# Patient Record
Sex: Male | Born: 1960
Health system: Southern US, Community
[De-identification: ages and names within clinical notes are randomized; demographics above are authoritative.]

## PROBLEM LIST (undated history)

## (undated) DIAGNOSIS — G43909 Migraine, unspecified, not intractable, without status migrainosus: Secondary | ICD-10-CM

## (undated) DIAGNOSIS — M1990 Unspecified osteoarthritis, unspecified site: Secondary | ICD-10-CM

## (undated) HISTORY — DX: Unspecified osteoarthritis, unspecified site: M19.90

## (undated) HISTORY — DX: Migraine, unspecified, not intractable, without status migrainosus: G43.909

---

## 1999-06-04 ENCOUNTER — Emergency Department (HOSPITAL_COMMUNITY): Admission: EM | Admit: 1999-06-04 | Discharge: 1999-06-04 | Payer: Self-pay

## 1999-08-01 ENCOUNTER — Emergency Department (HOSPITAL_COMMUNITY): Admission: EM | Admit: 1999-08-01 | Discharge: 1999-08-01 | Payer: Self-pay | Admitting: Emergency Medicine

## 1999-08-01 ENCOUNTER — Encounter: Payer: Self-pay | Admitting: Emergency Medicine

## 1999-09-10 ENCOUNTER — Emergency Department (HOSPITAL_COMMUNITY): Admission: EM | Admit: 1999-09-10 | Discharge: 1999-09-10 | Payer: Self-pay | Admitting: Emergency Medicine

## 1999-09-10 ENCOUNTER — Encounter: Payer: Self-pay | Admitting: Emergency Medicine

## 1999-09-13 ENCOUNTER — Emergency Department (HOSPITAL_COMMUNITY): Admission: EM | Admit: 1999-09-13 | Discharge: 1999-09-13 | Payer: Self-pay | Admitting: Emergency Medicine

## 1999-09-21 ENCOUNTER — Other Ambulatory Visit (HOSPITAL_COMMUNITY): Admission: RE | Admit: 1999-09-21 | Discharge: 1999-10-05 | Payer: Self-pay | Admitting: Psychiatry

## 2003-10-24 ENCOUNTER — Emergency Department (HOSPITAL_COMMUNITY): Admission: EM | Admit: 2003-10-24 | Discharge: 2003-10-24 | Payer: Self-pay | Admitting: Emergency Medicine

## 2006-09-22 ENCOUNTER — Emergency Department (HOSPITAL_COMMUNITY): Admission: EM | Admit: 2006-09-22 | Discharge: 2006-09-22 | Payer: Self-pay | Admitting: Emergency Medicine

## 2006-10-03 ENCOUNTER — Ambulatory Visit: Payer: Self-pay | Admitting: Nurse Practitioner

## 2006-10-06 ENCOUNTER — Emergency Department (HOSPITAL_COMMUNITY): Admission: EM | Admit: 2006-10-06 | Discharge: 2006-10-06 | Payer: Self-pay | Admitting: *Deleted

## 2006-11-08 ENCOUNTER — Emergency Department (HOSPITAL_COMMUNITY): Admission: EM | Admit: 2006-11-08 | Discharge: 2006-11-08 | Payer: Self-pay | Admitting: Emergency Medicine

## 2008-04-15 ENCOUNTER — Emergency Department (HOSPITAL_COMMUNITY): Admission: EM | Admit: 2008-04-15 | Discharge: 2008-04-15 | Payer: Self-pay | Admitting: Emergency Medicine

## 2008-10-01 ENCOUNTER — Emergency Department: Payer: Self-pay | Admitting: Emergency Medicine

## 2009-01-20 ENCOUNTER — Emergency Department (HOSPITAL_COMMUNITY): Admission: EM | Admit: 2009-01-20 | Discharge: 2009-01-21 | Payer: Self-pay | Admitting: Emergency Medicine

## 2009-04-14 ENCOUNTER — Ambulatory Visit: Payer: Self-pay | Admitting: Diagnostic Radiology

## 2009-04-14 ENCOUNTER — Emergency Department (HOSPITAL_BASED_OUTPATIENT_CLINIC_OR_DEPARTMENT_OTHER): Admission: EM | Admit: 2009-04-14 | Discharge: 2009-04-14 | Payer: Self-pay | Admitting: Emergency Medicine

## 2011-04-08 ENCOUNTER — Emergency Department (HOSPITAL_COMMUNITY)
Admission: EM | Admit: 2011-04-08 | Discharge: 2011-04-09 | Disposition: A | Discharge status: Home / Self Care | Payer: Self-pay | Attending: Emergency Medicine | Admitting: Emergency Medicine

## 2011-04-08 DIAGNOSIS — R51 Headache: Secondary | ICD-10-CM | POA: Insufficient documentation

## 2011-04-09 ENCOUNTER — Emergency Department (HOSPITAL_COMMUNITY): Payer: Self-pay

## 2011-04-09 LAB — POCT I-STAT, CHEM 8
Calcium, Ion: 1.17 mmol/L (ref 1.12–1.32)
Chloride: 107 mEq/L (ref 96–112)
Creatinine, Ser: 1 mg/dL (ref 0.50–1.35)
Glucose, Bld: 89 mg/dL (ref 70–99)
HCT: 45 % (ref 39.0–52.0)
Hemoglobin: 15.3 g/dL (ref 13.0–17.0)

## 2011-06-01 ENCOUNTER — Ambulatory Visit (INDEPENDENT_AMBULATORY_CARE_PROVIDER_SITE_OTHER): Payer: Self-pay | Admitting: Family Medicine

## 2011-06-01 ENCOUNTER — Encounter: Payer: Self-pay | Admitting: Family Medicine

## 2011-06-01 DIAGNOSIS — M19049 Primary osteoarthritis, unspecified hand: Secondary | ICD-10-CM

## 2011-06-01 DIAGNOSIS — K029 Dental caries, unspecified: Secondary | ICD-10-CM

## 2011-06-01 DIAGNOSIS — R519 Headache, unspecified: Secondary | ICD-10-CM | POA: Insufficient documentation

## 2011-06-01 DIAGNOSIS — R51 Headache: Secondary | ICD-10-CM

## 2011-06-01 MED ORDER — ACETAMINOPHEN 325 MG PO TABS
650.0000 mg | ORAL_TABLET | Freq: Three times a day (TID) | ORAL | Status: AC | PRN
Start: 2011-06-01 — End: 2011-06-11

## 2011-06-01 NOTE — Assessment & Plan Note (Signed)
Discussed with patient at this time will start treating conservatively with Tylenol 650 mg 3 times a day to see if this improves the pain. Once patient is covered by [facility] we'll consider then broadening the medications as well. Hopefully though this does help

## 2011-06-01 NOTE — Assessment & Plan Note (Signed)
Patient does have a left lower molar that is significantly necrotic and it has been decayed into the gum lining feel at this time this is likely the cause of patient's headache and do that it is dead and seems to be improving but concerned that the necrosis could expand to his gums. Patient has no insurance at this time so is unable to send him to a dentist. Patient is going to be seeing [facility] Eagles here in the near future and at that time once covered we will send him to a dentist at this time do not feel that patient needs any antibiotics he has had no fevers or chills we'll continue to monitor.

## 2011-06-01 NOTE — Progress Notes (Signed)
  Subjective:    Patient ID: Andrew Frye, male    DOB: 28-Sep-1960, 50 y.o.   MRN: [id number]  HPI 50 year old male coming here as a new patient to establish care. 1.  migraines. Patient has been seen in the emergency department on multiple occasions for having a left-sided migraine pain. Patient has never had a primary care provider before and is looking for somebody to help him with this problem. Patient was last seen though approximately 2 weeks ago and since then actually has not had any headaches. Patient did notice on the left side he does have a molar that seems to have a very large cavity in it and might have been causing him pain when to do so finally died the pain went away. Patient denies any type of fevers or chills denies any changes in vision denies any pulsatile headache denies noticing any other triggers.  2. Arthritis: Patient has been working with his hands his entire life and now with certain movements and change in weather he noticed that he has a lot of soreness and tightness in his hands. Patient still able to do all his activities of daily living but does notice that he has significant more pain than he did before. Patient has not tried taking any medications over-the-counter and has not tried ice or heat. Patient is unable to tell the exact that seemed to hurt him.  Denies any loss of strength or loss of weakness.   Review of Systems Denies fever, chills, nausea vomiting abdominal pain, dysuria, chest pain, shortness of breath dyspnea on exertion or numbness in extremities Past medical history, social, surgical and family history all reviewed.   Past Medical History  Diagnosis Date  . Migraines   . Arthritis     hands   Family History  Problem Relation Age of Onset  . Cancer Maternal Grandmother     breast  . Diabetes Mother   . Kidney disease Mother        Objective:   Physical Exam General: No apparent distress Patient does have a left lower molar that is  significantly necrotic and it has been decayed into the gum lining  Cardiovascular: Regular rate and rhythm no murmur Pulmonary: Clear to auscultation bilaterally Abdomen: Bowel sounds positive nontender no masses no organomegaly patient though does have a small inguinal lymph node is minorly tender patient does state he had a cold recently Muscle skeletal: Patient's hands have significant deformity of the first MCP joint with crepitus on movement patient also has some arthritic changes of most of the distal and proximal phalanx. Neurovascularly intact in all extremities    Assessment & Plan:

## 2011-06-01 NOTE — Patient Instructions (Signed)
Nice to meet you. I want you to get the [facility] card as soon as you can. After you get back card I when she to come back and see me we will then get she did see a dentist as well as scheduling a colonoscopy and get your labs I when she to try Tylenol 650 mg 3 times a day for your hand pain I will see you when you get the orange card Have a great Thanksgiving

## 2011-10-01 ENCOUNTER — Encounter (HOSPITAL_COMMUNITY): Payer: Self-pay | Admitting: *Deleted

## 2011-10-01 ENCOUNTER — Emergency Department (HOSPITAL_COMMUNITY)
Admission: EM | Admit: 2011-10-01 | Discharge: 2011-10-01 | Disposition: A | Discharge status: Home / Self Care | Payer: Self-pay | Attending: Emergency Medicine | Admitting: Emergency Medicine

## 2011-10-01 DIAGNOSIS — Z880 Allergy status to penicillin: Secondary | ICD-10-CM | POA: Insufficient documentation

## 2011-10-01 DIAGNOSIS — M19049 Primary osteoarthritis, unspecified hand: Secondary | ICD-10-CM | POA: Insufficient documentation

## 2011-10-01 DIAGNOSIS — G43909 Migraine, unspecified, not intractable, without status migrainosus: Secondary | ICD-10-CM | POA: Insufficient documentation

## 2011-10-01 DIAGNOSIS — K029 Dental caries, unspecified: Secondary | ICD-10-CM | POA: Insufficient documentation

## 2011-10-01 DIAGNOSIS — K047 Periapical abscess without sinus: Secondary | ICD-10-CM | POA: Insufficient documentation

## 2011-10-01 MED ORDER — CLINDAMYCIN HCL 150 MG PO CAPS: 300.0000 mg | ORAL_CAPSULE | Freq: Two times a day (BID) | ORAL | Status: AC

## 2011-10-01 MED ORDER — BUPIVACAINE HCL (PF) 0.5 % IJ SOLN
INTRAMUSCULAR | Status: AC
  Filled 2011-10-01: qty 10

## 2011-10-01 MED ORDER — BUPIVACAINE HCL 0.5 % IJ SOLN
50.0000 mL | INTRAMUSCULAR | Status: DC
  Filled 2011-10-01: qty 50

## 2011-10-01 MED ORDER — HYDROCODONE-ACETAMINOPHEN 5-325 MG PO TABS: 1.0000 | ORAL_TABLET | ORAL | Status: AC | PRN

## 2011-10-01 NOTE — ED Notes (Signed)
Pt c/o gum abscess lett lower back tooth.  St's has not drained. C/o pain

## 2011-10-01 NOTE — ED Provider Notes (Signed)
History     CSN: [id number]  Arrival date & time 10/01/11  2130   First MD Initiated Contact with Patient 10/01/11 2109      Chief Complaint  Patient presents with  . Dental Pain    (Consider location/radiation/quality/duration/timing/severity/associated sxs/prior treatment) HPI History provided by pt.   Pt has had a constant, gradually worsening, left lower toothache x 3-4 days.  Pain radiates up left side of face to his temple.  Little relief w/ ibuprofen/aleve.  No associated fever.  Pt does not currently have a dentist or dental insurance.   Past Medical History  Diagnosis Date  . Migraines   . Arthritis     hands    History reviewed. No pertinent past surgical history.  Family History  Problem Relation Age of Onset  . Cancer Maternal Grandmother     breast  . Diabetes Mother   . Kidney disease Mother     History  Substance Use Topics  . Smoking status: Never Smoker   . Smokeless tobacco: Not on file  . Alcohol Use: No      Review of Systems  All other systems reviewed and are negative.    Allergies  Penicillins  Home Medications   Current Outpatient Rx  Name Route Sig Dispense Refill  . IBUPROFEN 200 MG PO TABS Oral Take 400 mg by mouth every 6 (six) hours as needed. For pain.    Marland Kitchen NAPROXEN SODIUM 220 MG PO TABS Oral Take 440 mg by mouth 2 (two) times daily with a meal.      BP 145/84  Pulse 74  Temp 98.8 F (37.1 C)  SpO2 96%  Physical Exam  Nursing note and vitals reviewed. Constitutional: He is oriented to person, place, and time. He appears well-developed and well-nourished.  HENT:  Head: Normocephalic and atraumatic. No trismus in the jaw.  Mouth/Throat: Uvula is midline and oropharynx is clear and moist.       Left lower third molar avulsed and remainng tooth decayed.  Decay of left second lower molar as well.  Adjacent gingiva edematous and ttp.    Eyes:       Normal appearance  Neck: Normal range of motion. Neck supple.    Lymphadenopathy:    He has no cervical adenopathy.  Neurological: He is alert and oriented to person, place, and time.  Psychiatric: He has a normal mood and affect. His behavior is normal.    ED Course  Dental Date/Time: 10/01/2011 9:40 PM Performed by: Otilio Miu Authorized by: Ruby Cola E Consent: Verbal consent obtained. Risks and benefits: risks, benefits and alternatives were discussed Consent given by: patient Local anesthesia used: yes Local anesthetic: bupivacaine 0.5% without epinephrine Patient tolerance: Patient tolerated the procedure well with no immediate complications. Comments: Aspiration of moderate amt of pus from left lower gingival abscess.     (including critical care time)   Labs Reviewed - No data to display No results found.   1. Dental abscess       MDM  Pt presents w/ gingival abscess at left lower 2nd and 3rd molars.  Abscess anesthetized and aspirated and pain improved.  Pt d/c'd home clindamycin (pen allergic), hydrocodone and referral to dentist on call.  Return precautions discussed.         Arie Sabina [place], [place] 10/01/11 [phone]

## 2011-10-01 NOTE — Discharge Instructions (Signed)
Take antibiotic as prescribed.  Take vicodin as prescribed for severe pain.   Do not drive within four hours of taking this medication (may cause drowsiness or confusion).  Continue to take aleve twice a day OR ibuprofen three times a day with food as well.  Follow up with the dentist on call or one of the dental clinics provided to you.  You should return to the ER if the swelling worsens before you are able to see the dentist.

## 2011-10-01 NOTE — ED Notes (Signed)
Toothache and a headache for one week

## 2011-10-02 NOTE — ED Provider Notes (Signed)
Medical screening examination/treatment/procedure(s) were performed by non-physician practitioner and as supervising physician I was immediately available for consultation/collaboration.    Jeriko Kowalke L Shara Hartis, MD 10/02/11 0954 

## 2012-12-16 ENCOUNTER — Encounter (HOSPITAL_BASED_OUTPATIENT_CLINIC_OR_DEPARTMENT_OTHER): Payer: Self-pay | Admitting: *Deleted

## 2012-12-16 ENCOUNTER — Emergency Department (HOSPITAL_BASED_OUTPATIENT_CLINIC_OR_DEPARTMENT_OTHER)
Admission: EM | Admit: 2012-12-16 | Discharge: 2012-12-16 | Disposition: A | Discharge status: Home / Self Care | Payer: BC Managed Care – PPO | Attending: Emergency Medicine | Admitting: Emergency Medicine

## 2012-12-16 DIAGNOSIS — M542 Cervicalgia: Secondary | ICD-10-CM | POA: Insufficient documentation

## 2012-12-16 DIAGNOSIS — M25529 Pain in unspecified elbow: Secondary | ICD-10-CM | POA: Insufficient documentation

## 2012-12-16 DIAGNOSIS — Z8679 Personal history of other diseases of the circulatory system: Secondary | ICD-10-CM | POA: Insufficient documentation

## 2012-12-16 DIAGNOSIS — Z880 Allergy status to penicillin: Secondary | ICD-10-CM | POA: Insufficient documentation

## 2012-12-16 DIAGNOSIS — M25511 Pain in right shoulder: Secondary | ICD-10-CM

## 2012-12-16 DIAGNOSIS — M25519 Pain in unspecified shoulder: Secondary | ICD-10-CM | POA: Insufficient documentation

## 2012-12-16 DIAGNOSIS — M19049 Primary osteoarthritis, unspecified hand: Secondary | ICD-10-CM | POA: Insufficient documentation

## 2012-12-16 MED ORDER — PREDNISONE 10 MG PO TABS: 20.0000 mg | ORAL_TABLET | Freq: Two times a day (BID) | ORAL | Status: DC

## 2012-12-16 MED ORDER — HYDROCODONE-ACETAMINOPHEN 5-325 MG PO TABS: 2.0000 | ORAL_TABLET | ORAL | Status: DC | PRN

## 2012-12-16 NOTE — ED Notes (Signed)
C/o right neck pain, right shoulder pain and right elbow pain. Denies any injury. States pain is worse in the morning. States pain is constant. States pain for past 2 weeks.

## 2012-12-16 NOTE — ED Provider Notes (Signed)
History  This chart was scribed for Geoffery Lyons, MD by Ardelia Mems, ED Scribe. This patient was seen in room MH09/MH09 and the patient's care was started at 10:00 PM.   CSN: [id number]  Arrival date & time 12/16/12  5621      Chief Complaint  Patient presents with  . Shoulder Pain     The history is provided by the patient. No language interpreter was used.    HPI Comments: Andrew Frye is a 52 y.o. male who presents to the Emergency Department complaining of 3 weeks of constant,. moderate right- sided shoulder pain that is at its worst intensity in the morning. Pt states that his right elbow and the right side of his neck also hurt. Pt denies falls or recent injuries. Pt states that he has never had problems like these before. Pt states that his shoulder pain is exacerbated by movement of his right arm. Pt states that he has arthritis and that his entire right arm is sore.   Past Medical History  Diagnosis Date  . Migraines   . Arthritis     hands    History reviewed. No pertinent past surgical history.  Family History  Problem Relation Age of Onset  . Cancer Maternal Grandmother     breast  . Diabetes Mother   . Kidney disease Mother     History  Substance Use Topics  . Smoking status: Never Smoker   . Smokeless tobacco: Not on file  . Alcohol Use: No      Review of Systems  Constitutional: Negative for fever and chills.  HENT: Positive for neck pain.   Respiratory: Negative for shortness of breath.   Cardiovascular: Negative for chest pain.  Gastrointestinal: Negative for nausea, vomiting, abdominal pain and diarrhea.  Musculoskeletal: Negative for back pain.  Neurological: Negative for headaches.  All other systems reviewed and are negative.    Allergies  Penicillins  Home Medications   Current Outpatient Rx  Name  Route  Sig  Dispense  Refill  . ibuprofen (ADVIL,MOTRIN) 200 MG tablet   Oral   Take 400 mg by mouth every 6 (six) hours as  needed. For pain.         . naproxen sodium (ANAPROX) 220 MG tablet   Oral   Take 440 mg by mouth 2 (two) times daily with a meal.           Triage Vitals: BP 110/69  Pulse 64  Temp(Src) 98.9 F (37.2 C)  Resp 18  Ht 5\' 8"  (1.727 m)  Wt 172 lb 5 oz (78.16 kg)  BMI 26.21 kg/m2  SpO2 96%  Physical Exam  Nursing note and vitals reviewed. Constitutional: He is oriented to person, place, and time. He appears well-developed and well-nourished.  HENT:  Head: Normocephalic and atraumatic.  Eyes: EOM are normal. Pupils are equal, round, and reactive to light.  Neck: No tracheal deviation present.  Cardiovascular: Normal rate, regular rhythm and normal heart sounds.   No murmur heard. Pulmonary/Chest: Effort normal and breath sounds normal. No respiratory distress.  Abdominal: Soft. Bowel sounds are normal. There is no tenderness.  Musculoskeletal:  Tender to palpation over the right shoulder and right side of the neck. There is pain with ROM of the right shoulder. The ulnar and radial pulses are palpable. He is able to flex and extend all fingers. Sensation is intact in the right hand.  Neurological: He is alert and oriented to person, place, and time.  Skin: Skin is warm. No rash noted.  Psychiatric: He has a normal mood and affect.    ED Course  Procedures (including critical care time)  DIAGNOSTIC STUDIES: Oxygen Saturation is 96% on RA, adequate by my interpretation.    COORDINATION OF CARE: 10:04 PM- Pt advised of plan for treatment and pt agrees.     Labs Reviewed - No data to display No results found.   No diagnosis found.    MDM  Suspect radicular neck / shoulder pain.  Will treat with sling, prednisone, pain meds and prn follow up with pcp if not improving with this.         I personally performed the services described in this documentation, which was scribed in my presence. The recorded information has been reviewed and is accurate.       Geoffery Lyons, MD 12/17/12 [phone]

## 2013-01-17 ENCOUNTER — Encounter (HOSPITAL_COMMUNITY): Payer: Self-pay | Admitting: Emergency Medicine

## 2013-01-17 ENCOUNTER — Emergency Department (HOSPITAL_COMMUNITY): Payer: BC Managed Care – PPO

## 2013-01-17 ENCOUNTER — Emergency Department (HOSPITAL_COMMUNITY)
Admission: EM | Admit: 2013-01-17 | Discharge: 2013-01-17 | Disposition: A | Discharge status: Home / Self Care | Payer: BC Managed Care – PPO | Attending: Emergency Medicine | Admitting: Emergency Medicine

## 2013-01-17 DIAGNOSIS — M1990 Unspecified osteoarthritis, unspecified site: Secondary | ICD-10-CM

## 2013-01-17 DIAGNOSIS — M25521 Pain in right elbow: Secondary | ICD-10-CM

## 2013-01-17 DIAGNOSIS — Z8679 Personal history of other diseases of the circulatory system: Secondary | ICD-10-CM | POA: Insufficient documentation

## 2013-01-17 DIAGNOSIS — M129 Arthropathy, unspecified: Secondary | ICD-10-CM | POA: Insufficient documentation

## 2013-01-17 DIAGNOSIS — IMO0002 Reserved for concepts with insufficient information to code with codable children: Secondary | ICD-10-CM | POA: Insufficient documentation

## 2013-01-17 DIAGNOSIS — M25529 Pain in unspecified elbow: Secondary | ICD-10-CM | POA: Insufficient documentation

## 2013-01-17 MED ORDER — TRAMADOL HCL 50 MG PO TABS: 50.0000 mg | ORAL_TABLET | Freq: Four times a day (QID) | ORAL | Status: DC | PRN

## 2013-01-17 NOTE — ED Provider Notes (Signed)
History    CSN: [id number] Arrival date & time 01/17/13  1327  None    Chief Complaint  Patient presents with  . Elbow Pain   (Consider location/radiation/quality/duration/timing/severity/associated sxs/prior Treatment) HPI  Patient is a 52 year old man presenting to the emergency department for right aching burning elbow pain with radiation down his forearm. Patient rates his pain 7/10. States it has not improved despite medication provided that last ER visit in May. Patient has been using his sling as well since then. Patient has not followed up with primary care doctor as advised. Patient denies any new trauma or injury to elbow or shoulder since initial pain began in May. Patient denies any numbness or tingling or swelling in his elbow or shoulder  Past Medical History  Diagnosis Date  . Migraines   . Arthritis     hands   History reviewed. No pertinent past surgical history. Family History  Problem Relation Age of Onset  . Cancer Maternal Grandmother     breast  . Diabetes Mother   . Kidney disease Mother    History  Substance Use Topics  . Smoking status: Never Smoker   . Smokeless tobacco: Not on file  . Alcohol Use: No    Review of Systems  Constitutional: Negative for fever and chills.  Musculoskeletal: Positive for myalgias and arthralgias.  Skin: Negative.     Allergies  Penicillins  Home Medications   Current Outpatient Rx  Name  Route  Sig  Dispense  Refill  . ibuprofen (ADVIL,MOTRIN) 200 MG tablet   Oral   Take 400 mg by mouth every 6 (six) hours as needed. For pain.         . traMADol (ULTRAM) 50 MG tablet   Oral   Take 1 tablet (50 mg total) by mouth every 6 (six) hours as needed for pain.   15 tablet   0    BP 115/82  Pulse 60  Resp 16  SpO2 100% Physical Exam  Constitutional: He is oriented to person, place, and time. He appears well-developed and well-nourished. No distress.  HENT:  Head: Normocephalic and atraumatic.  Eyes:  Conjunctivae are normal.  Neck: Neck supple.  Musculoskeletal:       Right shoulder: Normal.       Right elbow: He exhibits decreased range of motion. He exhibits no swelling, no effusion, no deformity and no laceration. Tenderness found.       Right wrist: Normal.  Radial and ulnar pulses intact. Cap refill < 2 sec.   Neurological: He is alert and oriented to person, place, and time.  Skin: Skin is warm and dry. He is not diaphoretic.  Psychiatric: He has a normal mood and affect.    ED Course  Procedures (including critical care time) Labs Reviewed - No data to display Dg Elbow Complete Right  01/17/2013   *RADIOLOGY REPORT*  Clinical Data: Right elbow pain.  No known injury.  RIGHT ELBOW - COMPLETE 3+ VIEW  Comparison: None  Findings: The joint spaces are maintained.  No acute bony findings or significant degenerative changes.  Minimal spurring at the coronoid process.  A small olecranon spur is noted. There is also probable spurring change off the superior aspect of the olecranon. No joint effusion or calcified loose bodies.  IMPRESSION:  1.  No acute bony findings. 2.  Minimal degenerative changes. 3.  No joint effusion.   Original Report Authenticated By: Rudie Meyer, M.D.   1. Elbow pain, right  2. Arthritis     MDM  Neurovascularly intact. Imaging shows no fracture. Directed pt to ice injury, take acetaminophen or ibuprofen for pain, and to elevate and rest the injury when possible. Advised to continue to keep arm in sling for support. Advised to f/u w/ PCP and orthopedist. Patient is agreeable to plan. Patient is stable at time of discharge    Jeannetta Ellis, PA-C 01/17/13 1547

## 2013-01-17 NOTE — ED Provider Notes (Signed)
Medical screening examination/treatment/procedure(s) were performed by non-physician practitioner and as supervising physician I was immediately available for consultation/collaboration.   Rolan Bucco, MD 01/17/13 [phone]

## 2013-01-17 NOTE — ED Notes (Signed)
Per pt, was evaluated at [facility] in May for same symptoms-has not gotten better-has not followed up with PCP

## 2013-03-09 ENCOUNTER — Emergency Department (HOSPITAL_COMMUNITY)
Admission: EM | Admit: 2013-03-09 | Discharge: 2013-03-10 | Disposition: A | Discharge status: Home / Self Care | Payer: BC Managed Care – PPO | Attending: Emergency Medicine | Admitting: Emergency Medicine

## 2013-03-09 ENCOUNTER — Emergency Department (HOSPITAL_COMMUNITY): Payer: BC Managed Care – PPO

## 2013-03-09 ENCOUNTER — Encounter (HOSPITAL_COMMUNITY): Payer: Self-pay | Admitting: Emergency Medicine

## 2013-03-09 DIAGNOSIS — Z8679 Personal history of other diseases of the circulatory system: Secondary | ICD-10-CM | POA: Insufficient documentation

## 2013-03-09 DIAGNOSIS — M129 Arthropathy, unspecified: Secondary | ICD-10-CM | POA: Insufficient documentation

## 2013-03-09 DIAGNOSIS — Z79899 Other long term (current) drug therapy: Secondary | ICD-10-CM | POA: Insufficient documentation

## 2013-03-09 DIAGNOSIS — M25511 Pain in right shoulder: Secondary | ICD-10-CM

## 2013-03-09 DIAGNOSIS — Z880 Allergy status to penicillin: Secondary | ICD-10-CM | POA: Insufficient documentation

## 2013-03-09 DIAGNOSIS — IMO0002 Reserved for concepts with insufficient information to code with codable children: Secondary | ICD-10-CM

## 2013-03-09 DIAGNOSIS — S4980XA Other specified injuries of shoulder and upper arm, unspecified arm, initial encounter: Secondary | ICD-10-CM | POA: Insufficient documentation

## 2013-03-09 DIAGNOSIS — S46909A Unspecified injury of unspecified muscle, fascia and tendon at shoulder and upper arm level, unspecified arm, initial encounter: Secondary | ICD-10-CM | POA: Insufficient documentation
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
## 2013-03-10 MED ORDER — OXYCODONE-ACETAMINOPHEN 5-325 MG PO TABS
1.0000 | ORAL_TABLET | Freq: Once | ORAL | Status: AC
  Administered 2013-03-10: 1 via ORAL
  Filled 2013-03-10: qty 1

## 2013-03-10 MED ORDER — OXYCODONE-ACETAMINOPHEN 5-325 MG PO TABS: 1.0000 | ORAL_TABLET | ORAL | Status: DC | PRN

## 2013-03-10 NOTE — ED Provider Notes (Signed)
Medical screening examination/treatment/procedure(s) were performed by non-physician practitioner and as supervising physician I was immediately available for consultation/collaboration.  Jasmine Awe, MD 03/10/13 [phone]
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
## 2014-03-20 ENCOUNTER — Encounter (HOSPITAL_COMMUNITY): Payer: Self-pay | Admitting: Emergency Medicine

## 2014-03-20 ENCOUNTER — Emergency Department (HOSPITAL_COMMUNITY): Payer: BC Managed Care – PPO

## 2014-03-20 ENCOUNTER — Emergency Department (HOSPITAL_COMMUNITY)
Admission: EM | Admit: 2014-03-20 | Discharge: 2014-03-20 | Disposition: A | Discharge status: Home / Self Care | Payer: BC Managed Care – PPO | Attending: Emergency Medicine | Admitting: Emergency Medicine

## 2014-03-20 DIAGNOSIS — G8911 Acute pain due to trauma: Secondary | ICD-10-CM | POA: Insufficient documentation

## 2014-03-20 DIAGNOSIS — M5380 Other specified dorsopathies, site unspecified: Secondary | ICD-10-CM | POA: Insufficient documentation

## 2014-03-20 DIAGNOSIS — M545 Low back pain, unspecified: Secondary | ICD-10-CM | POA: Insufficient documentation

## 2014-03-20 DIAGNOSIS — M25559 Pain in unspecified hip: Secondary | ICD-10-CM | POA: Diagnosis not present

## 2014-03-20 DIAGNOSIS — Z8679 Personal history of other diseases of the circulatory system: Secondary | ICD-10-CM | POA: Insufficient documentation

## 2014-03-20 DIAGNOSIS — M25551 Pain in right hip: Secondary | ICD-10-CM

## 2014-03-20 DIAGNOSIS — Z8739 Personal history of other diseases of the musculoskeletal system and connective tissue: Secondary | ICD-10-CM | POA: Diagnosis not present

## 2014-03-20 DIAGNOSIS — Z880 Allergy status to penicillin: Secondary | ICD-10-CM | POA: Diagnosis not present

## 2014-03-20 MED ORDER — NAPROXEN 500 MG PO TABS: 500.0000 mg | ORAL_TABLET | Freq: Two times a day (BID) | ORAL | Status: DC | PRN

## 2014-03-20 MED ORDER — HYDROCODONE-ACETAMINOPHEN 5-325 MG PO TABS: 1.0000 | ORAL_TABLET | Freq: Four times a day (QID) | ORAL | Status: DC | PRN

## 2014-03-20 MED ORDER — CYCLOBENZAPRINE HCL 10 MG PO TABS: 10.0000 mg | ORAL_TABLET | Freq: Three times a day (TID) | ORAL | Status: DC | PRN

## 2014-03-20 MED ORDER — HYDROCODONE-ACETAMINOPHEN 5-325 MG PO TABS
1.0000 | ORAL_TABLET | Freq: Once | ORAL | Status: AC
  Administered 2014-03-20: 1 via ORAL
  Filled 2014-03-20: qty 1

## 2014-03-20 NOTE — ED Notes (Signed)
Pt from home.  Woke up this morning with low back pain (on the bone).  Pt states that he works in [employer] and sometimes gets back pain.  Denies dysuria, NVD.

## 2014-03-20 NOTE — ED Provider Notes (Signed)
CSN: [id number]     Arrival date & time 03/20/14  1039 History   First MD Initiated Contact with Patient 03/20/14 1127     Chief Complaint  Patient presents with  . Back Pain     (Consider location/radiation/quality/duration/timing/severity/associated sxs/prior Treatment) HPI Comments: Andrew Frye is a 53 y.o. male with a PMHx of arthritis, who presents to the ED with complaints of 3 days of achy, 9/10, constant pain in his lower back, worse on the right, which radiates down his right lateral thigh into his knee, as well as from the right hip into his right groin. He states that he has not tried anything for his pain, but twisting aggravates his pain. 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 He is injured his back previously it worked many years ago, but he has not had ongoing pain. Denies any fevers, chills, chest pain, shortness of breath, abdominal pain, nausea, vomiting, diarrhea, constipation, dysuria, hematuria, penile pain, penile discharge, testicular pain, testicular swelling, myalgias, paresthesias, weakness, or tingling. Denies any history of cancer.  Patient is a 53 y.o. male presenting with back pain. The history is provided by the patient. No language interpreter was used.  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 Migraines   . Arthritis     hands   No past surgical history on file. Family History  Problem Relation Age of Onset  . Cancer Maternal Grandmother     breast  . Diabetes Mother   . Kidney disease Mother    History  Substance Use Topics  . Smoking status: Never Smoker   . Smokeless tobacco: Not on file  . Alcohol Use: Yes     Comment: occasionally    Review of Systems  Constitutional: Negative for fever, chills, weight loss and fatigue.  Respiratory: Negative for shortness of breath.   Cardiovascular: Negative for chest pain and leg swelling.  Gastrointestinal: Negative for nausea, vomiting, abdominal pain, diarrhea, constipation and bowel incontinence.  Genitourinary: Negative for bladder incontinence, dysuria, urgency, frequency, hematuria, flank pain, decreased urine volume, discharge, penile swelling, scrotal swelling, difficulty urinating, penile pain, testicular pain and pelvic pain.  Musculoskeletal: Positive for back pain. Negative for arthralgias, joint swelling and myalgias.  Skin: Negative for color change.  Neurological: Negative for tingling, weakness, numbness, headaches and paresthesias.  10 Systems reviewed and are negative for acute change except as noted in the HPI.    Allergies  Penicillins  Home Medications   Prior to Admission medications   Not on File   BP 120/78  Pulse 79  Temp(Src) 98.5 F (36.9 C) (Oral)  Resp 22  SpO2 98% Physical Exam  Nursing note and vitals  reviewed. Constitutional: He is oriented to person, place, and time. Vital signs are normal. He appears well-developed and well-nourished. No distress.  Afebrile, VSS  HENT:  Head: Normocephalic and atraumatic.  Mouth/Throat: Mucous membranes are normal.  Eyes: Conjunctivae and EOM are normal. Right eye exhibits no discharge. Left  eye exhibits no discharge.  Neck: Normal range of motion. Neck supple. No spinous process tenderness and no muscular tenderness present. No rigidity. Normal range of motion present.  FROM intact without TTP over spinous processes or paracervical muscles  Cardiovascular: Normal rate and intact distal pulses.   Pulmonary/Chest: Effort normal. No respiratory distress.  Abdominal: Soft. Normal appearance and bowel sounds are normal. He exhibits no distension. There is no rigidity, no rebound, no guarding and no CVA tenderness.  Musculoskeletal: Normal range of motion.       Right hip: He exhibits tenderness. He exhibits normal range of motion, normal strength, no swelling, no crepitus and no deformity.       Lumbar back: He exhibits tenderness and spasm. He exhibits normal range of motion and no deformity.       Back:  FROM intact in all spinal levels. Cspine as above. Thoracic spine nonTTP. Lumbar spine with mild TTP along R sided paraspinous muscles, with minimal spasm noted. FROM in R hip, although mild lateral TTP over trochanteric bursa as well as in joint space. No swelling or crepitus. Strength and sensation at baseline in all extremities. Gait nonantalgic  Neurological: He is alert and oriented to person, place, and time. He has normal strength. No sensory deficit. Gait normal.  Skin: Skin is warm, dry and intact. No rash noted.  Psychiatric: He has a normal mood and affect.    ED Course  Procedures (including critical care time) Labs Review Labs Reviewed - No data to display  Imaging Review Dg Hip Complete Right  03/20/2014   CLINICAL DATA:  Right hip pain.  EXAM: RIGHT HIP - COMPLETE 2+ VIEW  COMPARISON:  None.  FINDINGS: Hip joint space is normal. No subchondral sclerosis or cyst formation or osteophytosis. No fracture or dislocation. Obturator rings are intact.  IMPRESSION: No acute or chronic changes in the hips bilaterally.   Electronically Signed   By: Leanna Battles M.D.   On:  03/20/2014 12:28     EKG Interpretation None      MDM   Final diagnoses:  Right-sided low back pain without sciatica  Right hip pain    53y/o male with low back and hip pain. No red flag s/s of low back pain. No s/s of central cord compression or cauda equina. Lower extremities are neurovascularly intact and patient is ambulating without difficulty. Xray of hip obtained to eval for arthritic or acute changes, and was negative. Pain meds given here with relief of symptoms.   Patient was counseled on back pain precautions and told to do activity as tolerated but do not lift, push, or pull heavy objects more than 10 pounds for the next week. Patient counseled to use ice or heat on back for no longer than 15 minutes every hour.   Rx given for muscle relaxer and counseled on proper use of muscle relaxant medication. Rx given for narcotic pain medicine and counseled on proper use of narcotic pain medications. Told he can increase to every 4 hrs if needed while pain is worse. Counseled not to combine this medication with others containing tylenol. Urged patient not to drink alcohol, drive, or perform any other activities that requires  focus while taking either of these medications.   Patient urged to follow-up with PCP if pain does not improve with treatment and rest or if pain becomes recurrent. Urged to return with worsening severe pain, loss of bowel or bladder control, trouble walking.   The patient verbalizes understanding and agrees with the plan. Stable at time of discharge.  BP 120/78  Pulse 79  Temp(Src) 98.5 F (36.9 C) (Oral)  Resp 22  SpO2 98%  Meds ordered this encounter  Medications  . HYDROcodone-acetaminophen (NORCO/VICODIN) 5-325 MG per tablet 1 tablet    Sig:   . naproxen (NAPROSYN) 500 MG tablet    Sig: Take 1 tablet (500 mg total) by mouth 2 (two) times daily as needed for mild pain, moderate pain or headache (TAKE WITH MEALS.).    Dispense:  20 tablet    Refill:   0    Order Specific Question:  Supervising Provider    Answer:  Eber Hong D [[id number]]  . HYDROcodone-acetaminophen (NORCO) 5-325 MG per tablet    Sig: Take 1-2 tablets by mouth every 6 (six) hours as needed for severe pain.    Dispense:  6 tablet    Refill:  0    Order Specific Question:  Supervising Provider    Answer:  Eber Hong D [[id number]]  . cyclobenzaprine (FLEXERIL) 10 MG tablet    Sig: Take 1 tablet (10 mg total) by mouth 3 (three) times daily as needed for muscle spasms.    Dispense:  15 tablet    Refill:  0    Order Specific Question:  Supervising Provider    Answer:  Eber Hong D [[id number]]        [place]nita [place] Camprubi-Soms, PA-C 03/20/14 1255

## 2014-03-20 NOTE — Discharge Instructions (Signed)
Back Pain: Your back pain should be treated with medicines such as ibuprofen or aleve and this back pain should get better over the next 2 weeks.  However if you develop severe or worsening pain, low back pain with fever, numbness, weakness or inability to walk or urinate, you should return to the ER immediately.  Please follow up with your doctor this week for a recheck if still having symptoms.  Low back pain is discomfort in the lower back that may be due to injuries to muscles and ligaments around the spine.  Occasionally, it may be caused by a a problem to a part of the spine called a disc.  The pain may last several days or a week;  However, most patients get completely well in 4 weeks.  Self - care:  The application of heat can help soothe the pain.  Maintaining your daily activities, including walking, is encourged, as it will help you get better faster than just staying in bed. Perform gentle stretching as discussed. Drink plenty of fluids.  Medications are also useful to help with pain control.  A commonly prescribed medications includes Norco. Don't drive or operate heavy machinery while using this medication  Non steroidal anti inflammatory medications including Ibuprofen and naproxen;  These medications help both pain and swelling and are very useful in treating back pain.  They should be taken with food, as they can cause stomach upset, and more seriously, stomach bleeding.    Muscle relaxants (flexeril):  These medications can help with muscle tightness that is a cause of lower back pain.  Most of these medications can cause drowsiness, and it is not safe to drive or use dangerous machinery while taking them.  SEEK IMMEDIATE MEDICAL ATTENTION IF: New numbness, tingling, weakness, or problem with the use of your arms or legs.  Severe back pain not relieved with medications.  Difficulty with or loss of control of your bowel or bladder control.  Increasing pain in any areas of the body  (such as chest or abdominal pain).  Shortness of breath, dizziness or fainting.  Nausea (feeling sick to your stomach), vomiting, fever, or sweats.  You will need to follow up with  Your primary healthcare provider in 1-2 weeks for reassessment.  If you do not have a doctor see the list below.   Back Pain, Adult Low back pain is very common. About 1 in 5 people have back pain.The cause of low back pain is rarely dangerous. The pain often gets better over time.About half of people with a sudden onset of back pain feel better in just 2 weeks. About 8 in 10 people feel better by 6 weeks.  CAUSES Some common causes of back pain include:  Strain of the muscles or ligaments supporting the spine.  Wear and tear (degeneration) of the spinal discs.  Arthritis.  Direct injury to the back. DIAGNOSIS Most of the time, the direct cause of low back pain is not known.However, back pain can be treated effectively even when the exact cause of the pain is unknown.Answering your caregiver's questions about your overall health and symptoms is one of the most accurate ways to make sure the cause of your pain is not dangerous. If your caregiver needs more information, he or she may order lab work or imaging tests (X-rays or MRIs).However, even if imaging tests show changes in your back, this usually does not require surgery. HOME CARE INSTRUCTIONS For many people, back pain returns.Since low back pain is  rarely dangerous, it is often a condition that people can learn to [facility] their own.   Remain active. It is stressful on the back to sit or stand in one place. Do not sit, drive, or stand in one place for more than 30 minutes at a time. Take short walks on level surfaces as soon as pain allows.Try to increase the length of time you walk each day.  Do not stay in bed.Resting more than 1 or 2 days can delay your recovery.  Do not avoid exercise or work.Your body is made to move.It is not dangerous  to be active, even though your back may hurt.Your back will likely heal faster if you return to being active before your pain is gone.  Pay attention to your body when you bend and lift. Many people have less discomfortwhen lifting if they bend their knees, keep the load close to their bodies,and avoid twisting. Often, the most comfortable positions are those that put less stress on your recovering back.  Find a comfortable position to sleep. Use a firm mattress and lie on your side with your knees slightly bent. If you lie on your back, put a pillow under your knees.  Only take over-the-counter or prescription medicines as directed by your caregiver. Over-the-counter medicines to reduce pain and inflammation are often the most helpful.Your caregiver may prescribe muscle relaxant drugs.These medicines help dull your pain so you can more quickly return to your normal activities and healthy exercise.  Put ice on the injured area.  Put ice in a plastic bag.  Place a towel between your skin and the bag.  Leave the ice on for 15-20 minutes, 03-04 times a day for the first 2 to 3 days. After that, ice and heat may be alternated to reduce pain and spasms.  Ask your caregiver about trying back exercises and gentle massage. This may be of some benefit.  Avoid feeling anxious or stressed.Stress increases muscle tension and can worsen back pain.It is important to recognize when you are anxious or stressed and learn ways to manage it.Exercise is a great option. SEEK MEDICAL CARE IF:  You have pain that is not relieved with rest or medicine.  You have pain that does not improve in 1 week.  You have new symptoms.  You are generally not feeling well. SEEK IMMEDIATE MEDICAL CARE IF:   You have pain that radiates from your back into your legs.  You develop new bowel or bladder control problems.  You have unusual weakness or numbness in your arms or legs.  You develop nausea or  vomiting.  You develop abdominal pain.  You feel faint. Document Released: 07/05/2005 Document Revised: 01/04/2012 Document Reviewed: 11/06/2013 [facility] Patient Information 2015 [place], [place]. This information is not intended to replace advice given to you by your health care provider. Make sure you discuss any questions you have with your health care provider.  Back Exercises Back exercises help treat and prevent back injuries. The goal of back exercises is to increase the strength of your abdominal and back muscles and the flexibility of your back. These exercises should be started when you no longer have back pain. Back exercises include:  Pelvic Tilt. Lie on your back with your knees bent. Tilt your pelvis until the lower part of your back is against the floor. Hold this position 5 to 10 sec and repeat 5 to 10 times.  Knee to Chest. Pull first 1 knee up against your chest and hold for  20 to 30 seconds, repeat this with the other knee, and then both knees. This may be done with the other leg straight or bent, whichever feels better.  Sit-Ups or Curl-Ups. Bend your knees 90 degrees. Start with tilting your pelvis, and do a partial, slow sit-up, lifting your trunk only 30 to 45 degrees off the floor. Take at least 2 to 3 seconds for each sit-up. Do not do sit-ups with your knees out straight. If partial sit-ups are difficult, simply do the above but with only tightening your abdominal muscles and holding it as directed.  Hip-Lift. Lie on your back with your knees flexed 90 degrees. Push down with your feet and shoulders as you raise your hips a couple inches off the floor; hold for 10 seconds, repeat 5 to 10 times.  Back arches. Lie on your stomach, propping yourself up on bent elbows. Slowly press on your hands, causing an arch in your low back. Repeat 3 to 5 times. Any initial stiffness and discomfort should lessen with repetition over time.  Shoulder-Lifts. Lie face down with arms beside  your body. Keep hips and torso pressed to floor as you slowly lift your head and shoulders off the floor. Do not overdo your exercises, especially in the beginning. Exercises may cause you some mild back discomfort which lasts for a few minutes; however, if the pain is more severe, or lasts for more than 15 minutes, do not continue exercises until you see your caregiver. Improvement with exercise therapy for back problems is slow.  See your caregivers for assistance with developing a proper back exercise program. Document Released: 08/12/2004 Document Revised: 09/27/2011 Document Reviewed: 05/06/2011 [facility] Patient Information 2015 [place], [place]. This information is not intended to replace advice given to you by your health care provider. Make sure you discuss any questions you have with your health care provider.  Heat Therapy Heat therapy can help make painful, stiff muscles and joints feel better. Do not use heat on new injuries. Wait at least 48 hours after an injury to use heat. Do not use heat when you have aches or pains right after an activity. If you still have pain 3 hours after stopping the activity, then you may use heat. HOME CARE Wet heat pack  Soak a clean towel in warm water. Squeeze out the extra water.  Put the warm, wet towel in a plastic bag.  Place a thin, dry towel between your skin and the bag.  Put the heat pack on the area for 5 minutes, and check your skin. Your skin may be pink, but it should not be red.  Leave the heat pack on the area for 15 to 30 minutes.  Repeat this every 2 to 4 hours while awake. Do not use heat while you are sleeping. Warm water bath  Fill a tub with warm water.  Place the affected body part in the tub.  Soak the area for 20 to 40 minutes.  Repeat as needed. Hot water bottle  Fill the water bottle half full with hot water.  Press out the extra air. Close the cap tightly.  Place a dry towel between your skin and the  bottle.  Put the bottle on the area for 5 minutes, and check your skin. Your skin may be pink, but it should not be red.  Leave the bottle on the area for 15 to 30 minutes.  Repeat this every 2 to 4 hours while awake. Electric heating pad  Place a  dry towel between your skin and the heating pad.  Set the heating pad on low heat.  Put the heating pad on the area for 10 minutes, and check your skin. Your skin may be pink, but it should not be red.  Leave the heating pad on the area for 20 to 40 minutes.  Repeat this every 2 to 4 hours while awake.  Do not lie on the heating pad.  Do not fall asleep while using the heating pad.  Do not use the heating pad near water. GET HELP RIGHT AWAY IF:  You get blisters or red skin.  Your skin is puffy (swollen), or you lose feeling (numbness) in the affected area.  You have any new problems.  Your problems are getting worse.  You have any questions or concerns. If you have any problems, stop using heat therapy until you see your doctor. MAKE SURE YOU:  Understand these instructions.  Will watch your condition.  Will get help right away if you are not doing well or get worse. Document Released: 09/27/2011 Document Reviewed: 08/28/2013 [facility] Patient Information 2015 [place], [place]. This information is not intended to replace advice given to you by your health care provider. Make sure you discuss any questions you have with your health care provider.

## 2014-03-20 NOTE — ED Provider Notes (Signed)
Medical screening examination/treatment/procedure(s) were performed by non-physician practitioner and as supervising physician I was immediately available for consultation/collaboration.   EKG Interpretation None        Purvis Sheffield, MD 03/20/14 [phone]

## 2014-05-11 ENCOUNTER — Emergency Department (HOSPITAL_COMMUNITY)
Admission: EM | Admit: 2014-05-11 | Discharge: 2014-05-11 | Disposition: A | Discharge status: Home / Self Care | Payer: Self-pay | Attending: Emergency Medicine | Admitting: Emergency Medicine

## 2014-05-11 ENCOUNTER — Emergency Department (HOSPITAL_COMMUNITY): Payer: BC Managed Care – PPO

## 2014-05-11 ENCOUNTER — Encounter (HOSPITAL_COMMUNITY): Payer: Self-pay | Admitting: Emergency Medicine

## 2014-05-11 DIAGNOSIS — Z8739 Personal history of other diseases of the musculoskeletal system and connective tissue: Secondary | ICD-10-CM | POA: Insufficient documentation

## 2014-05-11 DIAGNOSIS — Z8679 Personal history of other diseases of the circulatory system: Secondary | ICD-10-CM | POA: Insufficient documentation

## 2014-05-11 DIAGNOSIS — R079 Chest pain, unspecified: Secondary | ICD-10-CM | POA: Insufficient documentation

## 2014-05-11 DIAGNOSIS — Z880 Allergy status to penicillin: Secondary | ICD-10-CM | POA: Insufficient documentation

## 2014-05-11 LAB — COMPREHENSIVE METABOLIC PANEL
ALT: 13 U/L (ref 0–53)
AST: 17 U/L (ref 0–37)
Albumin: 3.5 g/dL (ref 3.5–5.2)
Alkaline Phosphatase: 67 U/L (ref 39–117)
Anion gap: 10 (ref 5–15)
BUN: 14 mg/dL (ref 6–23)
CALCIUM: 8.7 mg/dL (ref 8.4–10.5)
CO2: 24 meq/L (ref 19–32)
Chloride: 107 mEq/L (ref 96–112)
Creatinine, Ser: 1.04 mg/dL (ref 0.50–1.35)
GFR calc Af Amer: >90 mL/min (ref >90)
GFR calc non Af Amer: 79 mL/min — ABNORMAL LOW (ref >90)
Glucose, Bld: 89 mg/dL (ref 70–99)
POTASSIUM: 3.9 meq/L (ref 3.7–5.3)
SODIUM: 141 meq/L (ref 137–147)
TOTAL PROTEIN: 7.1 g/dL (ref 6.0–8.3)
Total Bilirubin: 0.3 mg/dL (ref 0.3–1.2)

## 2014-05-11 LAB — CBC
HCT: 42.2 % (ref 39.0–52.0)
HEMOGLOBIN: 15.1 g/dL (ref 13.0–17.0)
MCH: 31.1 pg (ref 26.0–34.0)
MCHC: 35.8 g/dL (ref 30.0–36.0)
MCV: 86.8 fL (ref 78.0–100.0)
Platelets: 232 10*3/uL (ref 150–400)
RBC: 4.86 MIL/uL (ref 4.22–5.81)
RDW: 14.6 % (ref 11.5–15.5)
WBC: 7.4 10*3/uL (ref 4.0–10.5)

## 2014-05-11 LAB — MAGNESIUM: MAGNESIUM: 2.3 mg/dL (ref 1.5–2.5)

## 2014-05-11 LAB — PROTIME-INR
INR: 1.02 (ref 0.00–1.49)
PROTHROMBIN TIME: 13.5 s (ref 11.6–15.2)

## 2014-05-11 LAB — TROPONIN I

## 2014-05-11 LAB — PRO B NATRIURETIC PEPTIDE: Pro B Natriuretic peptide (BNP): 27 pg/mL (ref 0–125)

## 2014-05-11 LAB — I-STAT TROPONIN, ED: TROPONIN I, POC: 0 ng/mL (ref 0.00–0.08)

## 2014-05-11 LAB — LIPASE, BLOOD: LIPASE: 40 U/L (ref 11–59)

## 2014-05-11 MED ORDER — OMEPRAZOLE 20 MG PO CPDR: 20.0000 mg | DELAYED_RELEASE_CAPSULE | Freq: Every day | ORAL | Status: DC

## 2014-05-11 MED ORDER — ASPIRIN 81 MG PO CHEW
CHEWABLE_TABLET | ORAL | Status: AC
  Filled 2014-05-11: qty 4

## 2014-05-11 MED ORDER — ASPIRIN 81 MG PO CHEW
324.0000 mg | CHEWABLE_TABLET | Freq: Once | ORAL | Status: AC
  Administered 2014-05-11: 324 mg via ORAL

## 2014-05-11 NOTE — Discharge Instructions (Signed)
As discussed, your evaluation today has been largely reassuring.  But, it is important that you monitor your condition carefully, and do not hesitate to return to the ED if you develop new, or concerning changes in your condition.  Your symptoms may be due to either spasm of the esophagus or irritation of the chest wall.  However, given that you have not had prior heart evaluation, it is important that you follow up with your physician to complete your evaluation for your ongoing pain.   Chest Pain (Nonspecific) It is often hard to give a specific diagnosis for the cause of chest pain. There is always a chance that your pain could be related to something serious, such as a heart attack or a blood clot in the lungs. You need to follow up with your health care provider for further evaluation. CAUSES   Heartburn.  Pneumonia or bronchitis.  Anxiety or stress.  Inflammation around your heart (pericarditis) or lung (pleuritis or pleurisy).  A blood clot in the lung.  A collapsed lung (pneumothorax). It can develop suddenly on its own (spontaneous pneumothorax) or from trauma to the chest.  Shingles infection (herpes zoster virus). The chest wall is composed of bones, muscles, and cartilage. Any of these can be the source of the pain.  The bones can be bruised by injury.  The muscles or cartilage can be strained by coughing or overwork.  The cartilage can be affected by inflammation and become sore (costochondritis). DIAGNOSIS  Lab tests or other studies may be needed to find the cause of your pain. Your health care provider may have you take a test called an ambulatory electrocardiogram (ECG). An ECG records your heartbeat patterns over a 24-hour period. You may also have other tests, such as:  Transthoracic echocardiogram (TTE). During echocardiography, sound waves are used to evaluate how blood flows through your heart.  Transesophageal echocardiogram (TEE).  Cardiac monitoring. This  allows your health care provider to monitor your heart rate and rhythm in real time.  Holter monitor. This is a portable device that records your heartbeat and can help diagnose heart arrhythmias. It allows your health care provider to track your heart activity for several days, if needed.  Stress tests by exercise or by giving medicine that makes the heart beat faster. TREATMENT   Treatment depends on what may be causing your chest pain. Treatment may include:  Acid blockers for heartburn.  Anti-inflammatory medicine.  Pain medicine for inflammatory conditions.  Antibiotics if an infection is present.  You may be advised to change lifestyle habits. This includes stopping smoking and avoiding alcohol, caffeine, and chocolate.  You may be advised to keep your head raised (elevated) when sleeping. This reduces the chance of acid going backward from your stomach into your esophagus. Most of the time, nonspecific chest pain will improve within 2-3 days with rest and mild pain medicine.  HOME CARE INSTRUCTIONS   If antibiotics were prescribed, take them as directed. Finish them even if you start to feel better.  For the next few days, avoid physical activities that bring on chest pain. Continue physical activities as directed.  Do not use any tobacco products, including cigarettes, chewing tobacco, or electronic cigarettes.  Avoid drinking alcohol.  Only take medicine as directed by your health care provider.  Follow your health care provider's suggestions for further testing if your chest pain does not go away.  Keep any follow-up appointments you made. If you do not go to an appointment, you  could develop lasting (chronic) problems with pain. If there is any problem keeping an appointment, call to reschedule. SEEK MEDICAL CARE IF:   Your chest pain does not go away, even after treatment.  You have a rash with blisters on your chest.  You have a fever. SEEK IMMEDIATE MEDICAL  CARE IF:   You have increased chest pain or pain that spreads to your arm, neck, jaw, back, or abdomen.  You have shortness of breath.  You have an increasing cough, or you cough up blood.  You have severe back or abdominal pain.  You feel nauseous or vomit.  You have severe weakness.  You faint.  You have chills. This is an emergency. Do not wait to see if the pain will go away. Get medical help at once. Call your local emergency services (911 in [place] Do not drive yourself to the hospital. MAKE SURE YOU:   Understand these instructions.  Will watch your condition.  Will get help right away if you are not doing well or get worse. Document Released: 04/14/2005 Document Revised: 07/10/2013 Document Reviewed: 02/08/2008 [facility] Patient Information 2015 [place]. This information is not intended to replace advice given to you by your health care provider. Make sure you discuss any questions you have with your health care provider.

## 2014-05-11 NOTE — ED Provider Notes (Signed)
CSN: [id number]     Arrival date & time 05/11/14  0024 History   First MD Initiated Contact with Patient 05/11/14 0132     Chief Complaint  Patient presents with  . Chest Pain     HPI Patient presents with a concern of one week of ongoing, intermittent, chest pain. No clear precipitant.  Since onset patient has had multiple episodes of squeezing sensation in the anterior chest.  The sensation does not radiate to his arms, neck, belly.  There is no concurrent dyspnea, nausea, vomiting, or syncope.  There are no clear alleviating, exacerbating, precipitating factors. Patient has no similar prior sensations. Patient does not smoke, only occasionally drinks, has no history of diabetes or hypertension.    Past Medical History  Diagnosis Date  . Migraines   . Arthritis     hands   History reviewed. No pertinent past surgical history. Family History  Problem Relation Age of Onset  . Cancer Maternal Grandmother     breast  . Diabetes Mother   . Kidney disease Mother    History  Substance Use Topics  . Smoking status: Never Smoker   . Smokeless tobacco: Not on file  . Alcohol Use: Yes     Comment: occasionally    Review of Systems  Constitutional:       Per HPI, otherwise negative  HENT:       Per HPI, otherwise negative  Respiratory:       Per HPI, otherwise negative  Cardiovascular:       Per HPI, otherwise negative  Gastrointestinal: Negative for vomiting.  Endocrine:       Negative aside from HPI  Genitourinary:       Neg aside from HPI   Musculoskeletal:       Per HPI, otherwise negative  Skin: Negative.   Neurological: Negative for syncope.      Allergies  Penicillins  Home Medications   Prior to Admission medications   Not on File   BP 96/54  Pulse 57  Temp(Src) 97.9 F (36.6 C) (Oral)  Resp 15  Ht 5\' 8"  (1.727 m)  Wt 175 lb (79.379 kg)  BMI 26.61 kg/m2  SpO2 97% Physical Exam  Nursing note and vitals reviewed. Constitutional: He is  oriented to person, place, and time. He appears well-developed. No distress.  HENT:  Head: Normocephalic and atraumatic.  Eyes: Conjunctivae and EOM are normal.  Cardiovascular: Normal rate and regular rhythm.   Pulmonary/Chest: Effort normal. No stridor. No respiratory distress.  Abdominal: He exhibits no distension.  Musculoskeletal: He exhibits no edema.  Neurological: He is alert and oriented to person, place, and time.  Skin: Skin is warm and dry.  Psychiatric: He has a normal mood and affect.    ED Course  Procedures (including critical care time) Labs Review Labs Reviewed  COMPREHENSIVE METABOLIC PANEL - Abnormal; Notable for the following:    GFR calc non Af Amer 79 (*)    All other components within normal limits  CBC  PRO B NATRIURETIC PEPTIDE  MAGNESIUM  PROTIME-INR  TROPONIN I  LIPASE, BLOOD  I-STAT TROPOININ, ED    Imaging Review Dg Chest 2 View  05/11/2014   CLINICAL DATA:  Intermittent left-sided chest pain for 1 week.  EXAM: CHEST  2 VIEW  COMPARISON:  None.  FINDINGS: The heart size and mediastinal contours are within normal limits. Both lungs are clear. The visualized skeletal structures are unremarkable.  IMPRESSION: No active cardiopulmonary disease.  Electronically Signed   By: Burman NievesWilliam  Stevens M.D.   On: 05/11/2014 03:11     EKG Interpretation   Date/Time:  Saturday May 11 2014 00:28:13 EDT Ventricular Rate:  65 PR Interval:  140 QRS Duration: 84 QT Interval:  434 QTC Calculation: 451 R Axis:   -55 Text Interpretation:  Normal sinus rhythm Left anterior fascicular block  Nonspecific ST abnormality Abnormal ECG Sinus rhythm Non-specific  intra-ventricular conduction delay T wave abnormality Abnormal ekg  Confirmed by Gerhard MunchLOCKWOOD, Adilynne Fitzwater  MD ([id number]) on 05/11/2014 1:21:45 AM     HEART score 2  On repeat exam the patient is in no distress, resting comfortably in his bed, no active pain. MDM   Patient presents with episodic chest pain.  On my  initial exam and throughout his emergency course, patient had no chest pain.  Patient has reassuring evaluation, and absent any ongoing chest pain from the low suspicion for unstable angina or atypical ACS. Patient is heart score of 2.  Patient has a primary care physician with whom he will followup in 2 days for further evaluation and management.     Gerhard Munchobert Charissa Knowles, MD 05/11/14 [phone]

## 2014-05-11 NOTE — ED Notes (Signed)
Pt placed back on monitor.

## 2014-05-11 NOTE — ED Notes (Signed)
Pt to ED c/o 8/10 chest pain x 1 week; denies shortness of breath or NV. Pain is intermittent; described as a sharp squeezing pain. Denies injury.

## 2014-05-11 NOTE — ED Notes (Signed)
Pt c/o intermittent non radiating left chest pain for 6-7 days. Pt denies any associated symptoms with the pain.

## 2014-12-13 ENCOUNTER — Other Ambulatory Visit (HOSPITAL_COMMUNITY): Payer: Self-pay | Admitting: Cardiology

## 2014-12-13 DIAGNOSIS — I712 Thoracic aortic aneurysm, without rupture, unspecified: Secondary | ICD-10-CM

## 2014-12-18 ENCOUNTER — Encounter (HOSPITAL_COMMUNITY): Payer: Self-pay

## 2014-12-18 ENCOUNTER — Ambulatory Visit (HOSPITAL_COMMUNITY)
Admission: RE | Admit: 2014-12-18 | Discharge: 2014-12-18 | Disposition: A | Discharge status: Home / Self Care | Payer: No Typology Code available for payment source | Source: Ambulatory Visit | Attending: Cardiology | Admitting: Cardiology

## 2014-12-18 DIAGNOSIS — R079 Chest pain, unspecified: Secondary | ICD-10-CM | POA: Insufficient documentation

## 2014-12-18 DIAGNOSIS — I712 Thoracic aortic aneurysm, without rupture, unspecified: Secondary | ICD-10-CM

## 2014-12-18 MED ORDER — IOHEXOL 350 MG/ML SOLN
100.0000 mL | Freq: Once | INTRAVENOUS | Status: AC | PRN
  Administered 2014-12-18: 100 mL via INTRAVENOUS

## 2015-04-03 ENCOUNTER — Emergency Department (HOSPITAL_COMMUNITY)
Admission: EM | Admit: 2015-04-03 | Discharge: 2015-04-03 | Disposition: A | Discharge status: Home / Self Care | Payer: No Typology Code available for payment source | Attending: Emergency Medicine | Admitting: Emergency Medicine

## 2015-04-03 ENCOUNTER — Encounter (HOSPITAL_COMMUNITY): Payer: Self-pay | Admitting: Emergency Medicine

## 2015-04-03 ENCOUNTER — Emergency Department (HOSPITAL_COMMUNITY): Payer: No Typology Code available for payment source

## 2015-04-03 DIAGNOSIS — Z8679 Personal history of other diseases of the circulatory system: Secondary | ICD-10-CM | POA: Insufficient documentation

## 2015-04-03 DIAGNOSIS — Y92194 Driveway of other specified residential institution as the place of occurrence of the external cause: Secondary | ICD-10-CM | POA: Diagnosis not present

## 2015-04-03 DIAGNOSIS — Z880 Allergy status to penicillin: Secondary | ICD-10-CM | POA: Diagnosis not present

## 2015-04-03 DIAGNOSIS — S299XXA Unspecified injury of thorax, initial encounter: Secondary | ICD-10-CM | POA: Insufficient documentation

## 2015-04-03 DIAGNOSIS — S199XXA Unspecified injury of neck, initial encounter: Secondary | ICD-10-CM | POA: Diagnosis present

## 2015-04-03 DIAGNOSIS — S134XXA Sprain of ligaments of cervical spine, initial encounter: Secondary | ICD-10-CM | POA: Insufficient documentation

## 2015-04-03 DIAGNOSIS — Z79899 Other long term (current) drug therapy: Secondary | ICD-10-CM | POA: Insufficient documentation

## 2015-04-03 DIAGNOSIS — Z8781 Personal history of (healed) traumatic fracture: Secondary | ICD-10-CM | POA: Diagnosis not present

## 2015-04-03 DIAGNOSIS — Y9389 Activity, other specified: Secondary | ICD-10-CM | POA: Insufficient documentation

## 2015-04-03 DIAGNOSIS — Y999 Unspecified external cause status: Secondary | ICD-10-CM | POA: Diagnosis not present

## 2015-04-03 DIAGNOSIS — M1990 Unspecified osteoarthritis, unspecified site: Secondary | ICD-10-CM | POA: Diagnosis not present

## 2015-04-03 DIAGNOSIS — S0990XA Unspecified injury of head, initial encounter: Secondary | ICD-10-CM | POA: Diagnosis not present

## 2015-04-03 DIAGNOSIS — R0781 Pleurodynia: Secondary | ICD-10-CM

## 2015-04-03 MED ORDER — IBUPROFEN 400 MG PO TABS: 800.0000 mg | ORAL_TABLET | Freq: Once | ORAL | Status: DC

## 2015-04-03 MED ORDER — IBUPROFEN 600 MG PO TABS: 600.0000 mg | ORAL_TABLET | Freq: Four times a day (QID) | ORAL | Status: DC | PRN

## 2015-04-03 MED ORDER — IBUPROFEN 400 MG PO TABS
800.0000 mg | ORAL_TABLET | Freq: Once | ORAL | Status: AC
  Administered 2015-04-03: 800 mg via ORAL
  Filled 2015-04-03: qty 2

## 2015-04-03 MED ORDER — HYDROCODONE-ACETAMINOPHEN 5-325 MG PO TABS
1.0000 | ORAL_TABLET | Freq: Once | ORAL | Status: AC
  Administered 2015-04-03: 1 via ORAL
  Filled 2015-04-03: qty 1

## 2015-04-03 MED ORDER — CYCLOBENZAPRINE HCL 10 MG PO TABS: 5.0000 mg | ORAL_TABLET | Freq: Once | ORAL | Status: DC

## 2015-04-03 MED ORDER — CYCLOBENZAPRINE HCL 10 MG PO TABS: 5.0000 mg | ORAL_TABLET | Freq: Two times a day (BID) | ORAL | Status: DC | PRN
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
## 2015-10-10 ENCOUNTER — Emergency Department (HOSPITAL_COMMUNITY): Payer: Self-pay

## 2015-10-10 ENCOUNTER — Encounter (HOSPITAL_COMMUNITY): Payer: Self-pay

## 2015-10-10 ENCOUNTER — Emergency Department (HOSPITAL_COMMUNITY)
Admission: EM | Admit: 2015-10-10 | Discharge: 2015-10-10 | Disposition: A | Discharge status: Home / Self Care | Payer: Self-pay | Attending: Emergency Medicine | Admitting: Emergency Medicine

## 2015-10-10 DIAGNOSIS — R079 Chest pain, unspecified: Secondary | ICD-10-CM | POA: Insufficient documentation

## 2015-10-10 LAB — I-STAT CHEM 8, ED
BUN: 18 mg/dL (ref 6–20)
CHLORIDE: 109 mmol/L (ref 101–111)
Calcium, Ion: 1.09 mmol/L — ABNORMAL LOW (ref 1.12–1.23)
Creatinine, Ser: 1.1 mg/dL (ref 0.61–1.24)
Glucose, Bld: 105 mg/dL — ABNORMAL HIGH (ref 65–99)
HEMATOCRIT: 48 % (ref 39.0–52.0)
Hemoglobin: 16.3 g/dL (ref 13.0–17.0)
POTASSIUM: 3.4 mmol/L — AB (ref 3.5–5.1)
SODIUM: 144 mmol/L (ref 135–145)
TCO2: 19 mmol/L (ref 0–100)

## 2015-10-10 LAB — CBC
HCT: 42.1 % (ref 39.0–52.0)
Hemoglobin: 14.7 g/dL (ref 13.0–17.0)
MCH: 30.1 pg (ref 26.0–34.0)
MCHC: 34.9 g/dL (ref 30.0–36.0)
MCV: 86.1 fL (ref 78.0–100.0)
PLATELETS: 237 10*3/uL (ref 150–400)
RBC: 4.89 MIL/uL (ref 4.22–5.81)
RDW: 15.7 % — ABNORMAL HIGH (ref 11.5–15.5)
WBC: 6.9 10*3/uL (ref 4.0–10.5)

## 2015-10-10 LAB — I-STAT TROPONIN, ED: TROPONIN I, POC: 0.01 ng/mL (ref 0.00–0.08)

## 2015-10-10 NOTE — ED Notes (Signed)
Pt complains of chest pain since 8 pm last night, he says it feels like pressure

## 2015-10-22 ENCOUNTER — Emergency Department (HOSPITAL_COMMUNITY)
Admission: EM | Admit: 2015-10-22 | Discharge: 2015-10-22 | Disposition: A | Discharge status: Home / Self Care | Payer: No Typology Code available for payment source | Attending: Emergency Medicine | Admitting: Emergency Medicine

## 2015-10-22 ENCOUNTER — Encounter (HOSPITAL_COMMUNITY): Payer: Self-pay | Admitting: Emergency Medicine

## 2015-10-22 DIAGNOSIS — M545 Low back pain: Secondary | ICD-10-CM | POA: Insufficient documentation

## 2015-10-22 NOTE — ED Notes (Signed)
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

## 2015-10-22 NOTE — ED Notes (Signed)
Pt not in waiting room Called x2 -5 minutes interval

## 2018-03-16 ENCOUNTER — Other Ambulatory Visit: Payer: Self-pay

## 2018-03-16 ENCOUNTER — Emergency Department (HOSPITAL_COMMUNITY): Payer: BLUE CROSS/BLUE SHIELD

## 2018-03-16 ENCOUNTER — Encounter (HOSPITAL_COMMUNITY): Payer: Self-pay | Admitting: *Deleted

## 2018-03-16 ENCOUNTER — Emergency Department (HOSPITAL_COMMUNITY)
Admission: EM | Admit: 2018-03-16 | Discharge: 2018-03-16 | Disposition: A | Discharge status: Home / Self Care | Payer: BLUE CROSS/BLUE SHIELD | Attending: Emergency Medicine | Admitting: Emergency Medicine

## 2018-03-16 DIAGNOSIS — R079 Chest pain, unspecified: Secondary | ICD-10-CM | POA: Diagnosis not present

## 2018-03-16 LAB — BASIC METABOLIC PANEL
ANION GAP: 10 (ref 5–15)
BUN: 15 mg/dL (ref 6–20)
CO2: 25 mmol/L (ref 22–32)
Calcium: 9.3 mg/dL (ref 8.9–10.3)
Chloride: 103 mmol/L (ref 98–111)
Creatinine, Ser: 1.19 mg/dL (ref 0.61–1.24)
GFR calc non Af Amer: >60 mL/min (ref >60)
GLUCOSE: 104 mg/dL — AB (ref 70–99)
POTASSIUM: 4.2 mmol/L (ref 3.5–5.1)
SODIUM: 138 mmol/L (ref 135–145)

## 2018-03-16 LAB — I-STAT TROPONIN, ED
TROPONIN I, POC: 0 ng/mL (ref 0.00–0.08)
Troponin i, poc: 0 ng/mL (ref 0.00–0.08)

## 2018-03-16 LAB — CBC
HEMATOCRIT: 45.5 % (ref 39.0–52.0)
HEMOGLOBIN: 15.4 g/dL (ref 13.0–17.0)
MCH: 30.4 pg (ref 26.0–34.0)
MCHC: 33.8 g/dL (ref 30.0–36.0)
MCV: 89.7 fL (ref 78.0–100.0)
Platelets: 292 10*3/uL (ref 150–400)
RBC: 5.07 MIL/uL (ref 4.22–5.81)
RDW: 14.6 % (ref 11.5–15.5)
WBC: 8 10*3/uL (ref 4.0–10.5)

## 2018-03-16 NOTE — ED Triage Notes (Signed)
Pt in c/o chest pain that stated a few hours ago, pain is intermittent, reports shortness of breath with this as well, no distress noted

## 2018-03-16 NOTE — ED Notes (Signed)
Patient ambulatory to exam room with steady gait and no acute distress at this time

## 2018-03-16 NOTE — ED Provider Notes (Signed)
[facility] Emergency Department Provider Note MRN:  [id number]  Arrival date & time: 03/16/18     Chief Complaint   Chest Pain   History of Present Illness   Andrew Frye is a 57 y.o. year-old male with a history of pulmonary hypertension presenting to the ED with chief complaint of chest pain.  Pain began 5 hours ago, sudden onset, located in the left side of the chest.  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  Described as a sharp pain, nonradiating, mild to moderate in severity.  Associated with mild shortness of breath.  Denies dizziness, no sweatiness, no nausea or vomiting.  Review of Systems  A complete 10 system review of systems was obtained and all systems are negative except as noted in the HPI and PMH.   Patient's Health History    Past Medical History:  Diagnosis Date  . Arthritis    hands  . Migraines   . Pulmonary hypertension (HCC)     History reviewed. No pertinent surgical history.  Family History  Problem Relation Age of Onset  . Cancer Maternal Grandmother        breast  . Diabetes Mother   . Kidney disease Mother     Social History   Socioeconomic History  . Marital status: Married    Spouse name: Not on file  . Number of children: Not on file  . Years of education: Not on file  . Highest education level: Not on file  Occupational History  . Not on file  Social Needs  . Financial resource strain: Not on file  . Food insecurity:    Worry: Not on file    Inability: Not on file  . Transportation needs:    Medical: Not on file    Non-medical: Not on file  Tobacco Use  . Smoking status: Never Smoker  . Smokeless tobacco: Never Used  Substance and Sexual Activity  . Alcohol use: Yes    Comment: occasionally  . Drug use: No    Comment: Patient has a history of cocaine abuse more than 10 years ago  . Sexual activity: Yes  Lifestyle  . Physical  activity:    Days per week: Not on file    Minutes per session: Not on file  . Stress: Not on file  Relationships  . Social connections:    Talks on phone: Not on file    Gets together: Not on file    Attends religious service: Not on file    Active member of club or organization: Not on file    Attends meetings of clubs or organizations: Not on file    Relationship status: Not on file  . Intimate partner violence:    Fear of current or ex partner: Not on file    Emotionally abused: Not on file    Physically abused: Not on file    Forced sexual activity: Not on file  Other Topics Concern  . Not on file  Social History Narrative   Patient lives with his wife they have 7 kids as well as 15 grand kids 2 of his children to have been deceased for a number of years both to gunshot wound     Physical Exam  Vital Signs and Nursing Notes reviewed Vitals:   03/16/18 1537 03/16/18 1630  BP: 126/89 122/84  Pulse: 63 (!) 56  Resp: 15 (!) 22  Temp:    SpO2: 96% 97%    CONSTITUTIONAL: Well-appearing, NAD NEURO:  Alert and oriented x 3, no focal deficits EYES:  eyes equal and reactive ENT/NECK:  no LAD, no JVD CARDIO: Regular rate, well-perfused, normal S1 and S2 PULM:  CTAB no wheezing or rhonchi GI/GU:  normal bowel sounds, non-distended, non-tender MSK/SPINE:  No gross deformities, no edema SKIN:  no rash, atraumatic PSYCH:  Appropriate speech and behavior  Diagnostic and Interventional Summary    EKG Interpretation  Date/Time:  Thursday March 16 2018 13:44:49 EDT Ventricular Rate:  70 PR Interval:  142 QRS Duration: 80 QT Interval:  418 QTC Calculation: 451 R Axis:   -68 Text Interpretation:  Normal sinus rhythm Left axis deviation Abnormal ECG Confirmed by Kennis CarinaBero, Jalynne Persico [phone]([id number]) on 03/16/2018 3:38:07 PM      Labs Reviewed  BASIC METABOLIC PANEL - Abnormal; Notable for the following components:      Result Value   Glucose, Bld 104 (*)    All other components within  normal limits  CBC  I-STAT TROPONIN, ED  I-STAT TROPONIN, ED    DG Chest 2 View  Final Result      Medications - No data to display   Procedures Critical Care  ED Course and Medical Decision Making  I have reviewed the triage vital signs and the nursing notes.  Pertinent labs & imaging results that were available during my care of the patient were reviewed by me and considered in my medical decision making (see below for details). Clinical Course as of Mar 16 1645  Thu Mar 16, 2018  1557 Favoring noncardiac chest pain in this 57 year old male with very few cardiac risk factors and atypical pain.  EKG with no ischemic changes, will obtain second troponin.  No risk factors for DVT or PE, little to no concern for PE today.  Favoring MSK.   [MB]    Clinical Course User Index [MB] Pilar PlateBero, Elmer SowMichael M, MD    Troponin negative x2, patient will follow up with primary care provider and agrees to discuss the need for stress testing as an outpatient.  After the discussed management above, the patient was determined to be safe for discharge.  The patient was in agreement with this plan and all questions regarding their care were answered.  ED return precautions were discussed and the patient will return to the ED with any significant worsening of condition.  Elmer SowMichael M. Pilar PlateBero, MD [facility] [email or web address]  Final Clinical Impressions(s) / ED Diagnoses     ICD-10-CM   1. Chest pain, unspecified type R07.9     ED Discharge Orders    None         Sabas SousBero, Dhruti Ghuman M, MD 03/16/18 [phone]

## 2018-03-16 NOTE — ED Notes (Signed)
Patient verbalizes understanding of discharge instructions. Opportunity for questioning and answers were provided. pt discharged from ED ambulatory.   

## 2018-03-16 NOTE — Discharge Instructions (Addendum)
You were evaluated in the Emergency Department and after careful evaluation, we did not find any emergent condition requiring admission or further testing in the hospital.  Please return to the Emergency Department if you experience any worsening of your condition.  We encourage you to follow up with a primary care provider to discuss your symptoms and discuss the need for future stress testing.  Thank you for allowing us to be a part of your care.

## 2018-10-18 ENCOUNTER — Encounter (HOSPITAL_COMMUNITY): Payer: Self-pay | Admitting: Emergency Medicine

## 2018-10-18 ENCOUNTER — Emergency Department (HOSPITAL_COMMUNITY): Payer: BLUE CROSS/BLUE SHIELD

## 2018-10-18 ENCOUNTER — Inpatient Hospital Stay (HOSPITAL_COMMUNITY)
Admission: EM | Admit: 2018-10-18 | Discharge: 2018-10-21 | DRG: 177 | Disposition: A | Discharge status: Home / Self Care | Payer: BLUE CROSS/BLUE SHIELD | Attending: Internal Medicine | Admitting: Internal Medicine

## 2018-10-18 ENCOUNTER — Other Ambulatory Visit: Payer: Self-pay

## 2018-10-18 DIAGNOSIS — I272 Pulmonary hypertension, unspecified: Secondary | ICD-10-CM | POA: Diagnosis present

## 2018-10-18 DIAGNOSIS — Z79899 Other long term (current) drug therapy: Secondary | ICD-10-CM

## 2018-10-18 DIAGNOSIS — J22 Unspecified acute lower respiratory infection: Secondary | ICD-10-CM

## 2018-10-18 DIAGNOSIS — J1289 Other viral pneumonia: Secondary | ICD-10-CM | POA: Diagnosis present

## 2018-10-18 DIAGNOSIS — Z7982 Long term (current) use of aspirin: Secondary | ICD-10-CM

## 2018-10-18 DIAGNOSIS — R0603 Acute respiratory distress: Secondary | ICD-10-CM

## 2018-10-18 DIAGNOSIS — J189 Pneumonia, unspecified organism: Secondary | ICD-10-CM

## 2018-10-18 DIAGNOSIS — J129 Viral pneumonia, unspecified: Secondary | ICD-10-CM | POA: Diagnosis present

## 2018-10-18 DIAGNOSIS — Z880 Allergy status to penicillin: Secondary | ICD-10-CM

## 2018-10-18 LAB — CBC WITH DIFFERENTIAL/PLATELET
Abs Immature Granulocytes: 0.08 10*3/uL — ABNORMAL HIGH (ref 0.00–0.07)
Basophils Absolute: 0 10*3/uL (ref 0.0–0.1)
Basophils Relative: 0 %
Eosinophils Absolute: 0 10*3/uL (ref 0.0–0.5)
Eosinophils Relative: 0 %
HCT: 46.1 % (ref 39.0–52.0)
Hemoglobin: 15.2 g/dL (ref 13.0–17.0)
Immature Granulocytes: 1 %
Lymphocytes Relative: 21 %
Lymphs Abs: 2.2 10*3/uL (ref 0.7–4.0)
MCH: 28 pg (ref 26.0–34.0)
MCHC: 33 g/dL (ref 30.0–36.0)
MCV: 84.9 fL (ref 80.0–100.0)
Monocytes Absolute: 0.5 10*3/uL (ref 0.1–1.0)
Monocytes Relative: 5 %
Neutro Abs: 7.6 10*3/uL (ref 1.7–7.7)
Neutrophils Relative %: 73 %
Platelets: 232 10*3/uL (ref 150–400)
RBC: 5.43 MIL/uL (ref 4.22–5.81)
RDW: 14.2 % (ref 11.5–15.5)
WBC: 10.5 10*3/uL (ref 4.0–10.5)
nRBC: 0 % (ref 0.0–0.2)

## 2018-10-18 LAB — RESPIRATORY PANEL BY PCR

## 2018-10-18 LAB — BASIC METABOLIC PANEL
Anion gap: 15 (ref 5–15)
BUN: 10 mg/dL (ref 6–20)
CO2: 17 mmol/L — ABNORMAL LOW (ref 22–32)
Calcium: 8.3 mg/dL — ABNORMAL LOW (ref 8.9–10.3)
Chloride: 102 mmol/L (ref 98–111)
Creatinine, Ser: 1.12 mg/dL (ref 0.61–1.24)
GFR calc Af Amer: >60 mL/min (ref >60)
GFR calc non Af Amer: >60 mL/min (ref >60)
Glucose, Bld: 83 mg/dL (ref 70–99)
Potassium: 3.3 mmol/L — ABNORMAL LOW (ref 3.5–5.1)
Sodium: 134 mmol/L — ABNORMAL LOW (ref 135–145)

## 2018-10-18 LAB — MRSA PCR SCREENING: MRSA by PCR: NEGATIVE

## 2018-10-18 MED ORDER — SODIUM CHLORIDE 0.9 % IV SOLN
1.0000 g | INTRAVENOUS | Status: DC
  Filled 2018-10-18 (×2): qty 10

## 2018-10-18 MED ORDER — ACETAMINOPHEN 325 MG PO TABS
650.0000 mg | ORAL_TABLET | Freq: Four times a day (QID) | ORAL | Status: DC | PRN
  Administered 2018-10-19 – 2018-10-20 (×5): 650 mg via ORAL
  Filled 2018-10-18 (×5): qty 2

## 2018-10-18 MED ORDER — ACETAMINOPHEN 500 MG PO TABS
1000.0000 mg | ORAL_TABLET | Freq: Once | ORAL | Status: AC
Start: 2018-10-18 — End: 2018-10-18
  Administered 2018-10-18: 1000 mg via ORAL
  Filled 2018-10-18: qty 2

## 2018-10-18 MED ORDER — ACETAMINOPHEN 650 MG RE SUPP: 650.0000 mg | Freq: Four times a day (QID) | RECTAL | Status: DC | PRN

## 2018-10-18 MED ORDER — IOPAMIDOL (ISOVUE-300) INJECTION 61%
75.0000 mL | Freq: Once | INTRAVENOUS | Status: AC | PRN
  Administered 2018-10-18: 75 mL via INTRAVENOUS

## 2018-10-18 MED ORDER — SODIUM CHLORIDE 0.9 % IV SOLN
500.0000 mg | INTRAVENOUS | Status: DC
  Filled 2018-10-18 (×2): qty 500

## 2018-10-18 MED ORDER — CEFTRIAXONE SODIUM 1 G IJ SOLR
1.0000 g | Freq: Once | INTRAMUSCULAR | Status: AC
  Administered 2018-10-18: 1 g via INTRAVENOUS
  Filled 2018-10-18: qty 10

## 2018-10-18 MED ORDER — SODIUM CHLORIDE 0.9 % IV SOLN
500.0000 mg | Freq: Once | INTRAVENOUS | Status: AC
  Administered 2018-10-18: 15:00:00 500 mg via INTRAVENOUS
  Filled 2018-10-18: qty 500

## 2018-10-18 MED ORDER — PROMETHAZINE HCL 25 MG PO TABS: 12.5000 mg | ORAL_TABLET | Freq: Four times a day (QID) | ORAL | Status: DC | PRN

## 2018-10-18 MED ORDER — SENNOSIDES-DOCUSATE SODIUM 8.6-50 MG PO TABS: 1.0000 | ORAL_TABLET | Freq: Every evening | ORAL | Status: DC | PRN

## 2018-10-18 MED ORDER — ENOXAPARIN SODIUM 40 MG/0.4ML ~~LOC~~ SOLN
40.0000 mg | SUBCUTANEOUS | Status: DC
  Administered 2018-10-18 – 2018-10-20 (×3): 40 mg via SUBCUTANEOUS
  Filled 2018-10-18 (×3): qty 0.4

## 2018-10-18 NOTE — Progress Notes (Signed)
OK to put in continuous orders for ceftriaxone and azithromycin per Dr. Avie Arenas.  Ulyses Southward, PharmD, BCIDP, AAHIVP, CPP Infectious Disease Pharmacist 10/18/2018 9:22 PM

## 2018-10-18 NOTE — ED Notes (Signed)
ED TO INPATIENT HANDOFF REPORT  ED Nurse Name and Phone #: [phone]  S Name/Age/Gender Andrew Frye 58 y.o. male Room/Bed: 030C/030C  Code Status   Code Status: Full Code  Home/SNF/Other Home Patient oriented to: self, place, time and situation Is this baseline? Yes   Triage Complete: Triage complete  Chief Complaint Chest Tightness; Fever; SOB  Triage Note Pt in from home with c/o fever, sob, cough and chest tightness x 6 days. States he went to [facility] 4 days ago and was dx with bronchitis, cxr was negative for PNA, sent home w/Tessalon pearls and Albuterol inhaler. Oral temp 99.4 on arrival, states he feels no better and still has dry cough.   Allergies Allergies  Allergen Reactions  . Penicillins Hives    Has patient had a PCN reaction causing immediate rash, facial/tongue/throat swelling, SOB or lightheadedness with hypotension: No Has patient had a PCN reaction causing severe rash involving mucus membranes or skin necrosis: No Has patient had a PCN reaction that required hospitalization: No Has patient had a PCN reaction occurring within the last 10 years: Non If all of the above answers are "NO", then may proceed with Cephalosporin use.     Level of Care/Admitting Diagnosis ED Disposition    ED Disposition Condition Comment   Admit  Hospital Area: [facility] [[id number]]  Level of Care: Progressive [102]  Diagnosis: Acute respiratory distress [119417]  Admitting Physician: Silvio Pate  Attending Physician: Gust Rung [[id number]]  Estimated length of stay: past midnight tomorrow  Certification:: I certify this patient will need inpatient services for at least 2 midnights  PT Class (Do Not Modify): Inpatient [101]  PT Acc Code (Do Not Modify): Private [1]       B Medical/Surgery History Past Medical History:  Diagnosis Date  . Arthritis    hands  . Migraines   . Pulmonary hypertension (HCC)    History reviewed. No pertinent  surgical history.   A IV Location/Drains/Wounds Patient Lines/Drains/Airways Status   Active Line/Drains/Airways    Name:   Placement date:   Placement time:   Site:   Days:   Peripheral IV 10/18/18 Right Wrist   10/18/18    1111    Wrist   less than 1          Intake/Output Last 24 hours  Intake/Output Summary (Last 24 hours) at 10/18/2018 1649 Last data filed at 10/18/2018 1605 Gross per 24 hour  Intake 350 ml  Output -  Net 350 ml    Labs/Imaging Results for orders placed or performed during the hospital encounter of 10/18/18 (from the past 48 hour(s))  Basic metabolic panel     Status: Abnormal   Collection Time: 10/18/18 11:12 AM  Result Value Ref Range   Sodium 134 (L) 135 - 145 mmol/L   Potassium 3.3 (L) 3.5 - 5.1 mmol/L   Chloride 102 98 - 111 mmol/L   CO2 17 (L) 22 - 32 mmol/L   Glucose, Bld 83 70 - 99 mg/dL   BUN 10 6 - 20 mg/dL   Creatinine, Ser 4.08 0.61 - 1.24 mg/dL   Calcium 8.3 (L) 8.9 - 10.3 mg/dL   GFR calc non Af Amer >60 >60 mL/min   GFR calc Af Amer >60 >60 mL/min   Anion gap 15 5 - 15    Comment: Performed at [facility], [address], [place], [place] [address]  CBC with Differential  Status: Abnormal   Collection Time: 10/18/18 11:12 AM  Result Value Ref Range   WBC 10.5 4.0 - 10.5 K/uL   RBC 5.43 4.22 - 5.81 MIL/uL   Hemoglobin 15.2 13.0 - 17.0 g/dL   HCT 97.9 89.2 - 11.9 %   MCV 84.9 80.0 - 100.0 fL   MCH 28.0 26.0 - 34.0 pg   MCHC 33.0 30.0 - 36.0 g/dL   RDW 41.7 40.8 - 14.4 %   Platelets 232 150 - 400 K/uL   nRBC 0.0 0.0 - 0.2 %   Neutrophils Relative % 73 %   Neutro Abs 7.6 1.7 - 7.7 K/uL   Lymphocytes Relative 21 %   Lymphs Abs 2.2 0.7 - 4.0 K/uL   Monocytes Relative 5 %   Monocytes Absolute 0.5 0.1 - 1.0 K/uL   Eosinophils Relative 0 %   Eosinophils Absolute 0.0 0.0 - 0.5 K/uL   Basophils Relative 0 %   Basophils Absolute 0.0 0.0 - 0.1 K/uL   Immature Granulocytes 1 %   Abs Immature Granulocytes 0.08 (H) 0.00 - 0.07  K/uL    Comment: Performed at [facility], [address] [place], [place] [address]   Dg Chest 2 View  Result Date: 10/18/2018 CLINICAL DATA:  Cough, shortness of breath EXAM: CHEST - 2 VIEW COMPARISON:  03/16/2018 FINDINGS: Cardiomegaly. No confluent opacities, effusions or edema. No acute bony abnormality. IMPRESSION: Cardiomegaly.  No active disease. Electronically Signed   By: Charlett Nose M.D.   On: 10/18/2018 11:45   Ct Chest W Contrast  Result Date: 10/18/2018 CLINICAL DATA:  Chest pain and cough with shortness of breath EXAM: CT CHEST WITH CONTRAST TECHNIQUE: Multidetector CT imaging of the chest was performed during intravenous contrast administration. CONTRAST:  74mL ISOVUE-300 COMPARISON:  Chest x-ray from earlier in the same day, CT from 12/18/2014 FINDINGS: Cardiovascular: Thoracic aorta demonstrates a slight variant pattern with the left vertebral artery arising directly from the aortic arch. No aneurysmal dilatation or dissection is seen. No significant atherosclerotic calcifications are noted. No cardiac enlargement is seen. Minimal coronary calcifications are noted. The pulmonary artery as visualized is within normal limits. No definitive pulmonary emboli are seen. The peripheral inferior branches are incompletely opacified due to timing of the contrast bolus. Mediastinum/Nodes: The esophagus as visualized is within normal limits. Thoracic inlet is within normal limits. No significant mediastinal or hilar adenopathy is noted. Lungs/Pleura: Lungs are well aerated bilaterally. No pneumothorax or effusion is seen. Diffuse patchy ground-glass opacities are noted throughout both lungs slightly greater on the right than the left in a predominantly peripheral distribution. No sizable nodules are seen. Upper Abdomen: Visualized upper abdomen shows no acute abnormality. Musculoskeletal: Degenerative changes of the thoracic spine are noted. No acute bony abnormality is seen. IMPRESSION: There  are a spectrum of findings in the lungs which can be seen with acute atypical infection (as well as other non-infectious etiologies). In particular, viral pneumonia (including COVID-19) should be considered in the appropriate clinical setting. If there is clinical concern for acute pulmonary infection, consultation with Infectious Disease or Pulmonary Medicine is recommend. No other acute abnormality is noted. Critical Value/emergent results were called by telephone at the time of interpretation on 10/18/2018 at 1:18 pm to [facility] , who verbally acknowledged these results. Electronically Signed   By: Alcide Clever M.D.   On: 10/18/2018 13:18    Pending Labs [employer] (From admission, onward)    Start     Ordered  10/19/18 0500  Basic metabolic panel  Tomorrow morning,   R     10/18/18 1543   10/19/18 0500  CBC  Tomorrow morning,   R     10/18/18 1543   10/18/18 1528  HIV antibody (Routine Testing)  Once,   R     10/18/18 1543   10/18/18 1420  Respiratory Panel by PCR  (Respiratory virus panel with precautions)  Once,   R     10/18/18 1420   10/18/18 1420  Novel Coronavirus, NAA (hospital order; send-out to ref lab)  (Novel Coronavirus, NAA [facility] Order; send-out to ref lab) with precautions panel)  Once,   R    Question Answer Comment  Current symptoms Fever and Shortness of breath   Excluded other viral illnesses No (testing not indicated)      10/18/18 1420          Vitals/Pain Today's Vitals   10/18/18 1415 10/18/18 1515 10/18/18 1545 10/18/18 1600  BP: 117/69 109/67 105/74 105/65  Pulse:      Resp: (!) 32 19 (!) 25 (!) 21  Temp:      TempSrc:      SpO2:      Weight:      PainSc:        Isolation Precautions Droplet precaution  Medications Medications  enoxaparin (LOVENOX) injection 40 mg (has no administration in time range)  acetaminophen (TYLENOL) tablet 650 mg (has no administration in time range)    Or  acetaminophen (TYLENOL) suppository  650 mg (has no administration in time range)  senna-docusate (Senokot-S) tablet 1 tablet (has no administration in time range)  promethazine (PHENERGAN) tablet 12.5 mg (has no administration in time range)  iopamidol (ISOVUE-300) 61 % injection 75 mL (75 mLs Intravenous Contrast Given 10/18/18 1300)  acetaminophen (TYLENOL) tablet 1,000 mg (1,000 mg Oral Given 10/18/18 1343)  cefTRIAXone (ROCEPHIN) 1 g in sodium chloride 0.9 % 100 mL IVPB (0 g Intravenous Stopped 10/18/18 1438)  azithromycin (ZITHROMAX) 500 mg in sodium chloride 0.9 % 250 mL IVPB (0 mg Intravenous Stopped 10/18/18 1605)    Mobility walks Low fall risk   Focused Assessments Pulmonary Assessment Handoff:  Lung sounds: Bilateral Breath Sounds: Diminished O2 Device: Room Air        R Recommendations: See Admitting Provider Note  Report given to:   Additional Notes: pt is "high risk" per CT, remains on droplet/contact precautions unless aerosolyzing procedures being done

## 2018-10-18 NOTE — ED Notes (Signed)
Patient transported to X-ray 

## 2018-10-18 NOTE — ED Provider Notes (Signed)
[facility] EMERGENCY DEPARTMENT Provider Note   CSN: [id number] Arrival date & time: 10/18/18  1030    History   Chief Complaint Chief Complaint  Patient presents with   Chest Tightness   Fever   Shortness of Breath    HPI Andrew Frye is a 58 y.o. male.     HPI Patient presents to the emergency department with cough with shortness of breath and chest tightness and subjective fever over the last 6 days.  Patient states that went to an urgent care on Sunday and was tested for the flu which was negative and a negative chest x-ray.  The patient states that he has been getting worse over the last few days.  The patient states that they did give him some medications which did not seem to help.  Patient states that he did not take any other medications prior to arrival.  The patient denies chest pain,  headache,blurred vision, neck pain,weakness, numbness, dizziness, anorexia, edema, abdominal pain, nausea, vomiting, diarrhea, rash, back pain, dysuria, hematemesis, bloody stool, near syncope, or syncope. Past Medical History:  Diagnosis Date   Arthritis    hands   Migraines    Pulmonary hypertension [facility])     Patient Active Problem List   Diagnosis Date Noted   Arthritis pain, hand 06/01/2011   Headache(784.0) 06/01/2011   Dental cavity 06/01/2011    History reviewed. No pertinent surgical history.      Home Medications    Prior to Admission medications   Medication Sig Start Date End Date Taking? Authorizing Provider  cyclobenzaprine (FLEXERIL) 10 MG tablet Take 0.5-1 tablets (5-10 mg total) by mouth 2 (two) times daily as needed for muscle spasms. Patient not taking: Reported on 03/16/2018 04/03/15   Marlon Pel, PA-C  ibuprofen (ADVIL,MOTRIN) 200 MG tablet Take 200 mg by mouth every 6 (six) hours as needed for moderate pain.    [provider]  ibuprofen (ADVIL,MOTRIN) 600 MG tablet Take 1 tablet (600 mg total) by mouth every 6  (six) hours as needed. Patient not taking: Reported on 03/16/2018 04/03/15   Marlon Pel, PA-C  meloxicam (MOBIC) 15 MG tablet Take 15 mg by mouth daily.    [provider]  omeprazole (PRILOSEC) 20 MG capsule Take 1 capsule (20 mg total) by mouth daily. Patient not taking: Reported on 03/16/2018 05/11/14   Gerhard Munch, MD    Family History Family History  Problem Relation Age of Onset   Cancer Maternal Grandmother        breast   Diabetes Mother    Kidney disease Mother     Social History Social History   Tobacco Use   Smoking status: Never Smoker   Smokeless tobacco: Never Used  Substance Use Topics   Alcohol use: Yes    Comment: occasionally   Drug use: No    Comment: Patient has a history of cocaine abuse more than 10 years ago     Allergies   Penicillins   Review of Systems Review of Systems All other systems negative except as documented in the HPI. All pertinent positives and negatives as reviewed in the HPI.  Physical Exam Updated Vital Signs BP 117/69    Pulse 82    Temp 99.4 F (37.4 C) (Oral)    Resp (!) 32    Wt 79.4 kg    SpO2 100%    BMI 26.62 kg/m   Physical Exam Vitals signs and nursing note reviewed.  Constitutional:  General: He is not in acute distress.    Appearance: He is well-developed.  HENT:     Head: Normocephalic and atraumatic.  Eyes:     Pupils: Pupils are equal, round, and reactive to light.  Neck:     Musculoskeletal: Normal range of motion and neck supple.  Cardiovascular:     Rate and Rhythm: Normal rate and regular rhythm.     Heart sounds: Normal heart sounds. No murmur. No friction rub. No gallop.   Pulmonary:     Effort: Tachypnea and respiratory distress present.     Breath sounds: Examination of the right-upper field reveals wheezing. Decreased breath sounds and wheezing present. No rhonchi.  Abdominal:     General: Bowel sounds are normal. There is no distension.     Palpations: Abdomen is  soft.     Tenderness: There is no abdominal tenderness.  Skin:    General: Skin is warm and dry.     Capillary Refill: Capillary refill takes less than 2 seconds.     Findings: No erythema or rash.  Neurological:     Mental Status: He is alert and oriented to person, place, and time.     Motor: No abnormal muscle tone.     Coordination: Coordination normal.  Psychiatric:        Behavior: Behavior normal.      ED Treatments / Results  Labs (all labs ordered are listed, but only abnormal results are displayed) Labs Reviewed  BASIC METABOLIC PANEL - Abnormal; Notable for the following components:      Result Value   Sodium 134 (*)    Potassium 3.3 (*)    CO2 17 (*)    Calcium 8.3 (*)    All other components within normal limits  CBC WITH DIFFERENTIAL/PLATELET - Abnormal; Notable for the following components:   Abs Immature Granulocytes 0.08 (*)    All other components within normal limits  RESPIRATORY PANEL BY PCR  NOVEL CORONAVIRUS, NAA (HOSPITAL ORDER, SEND-OUT TO REF LAB)    EKG EKG Interpretation  Date/Time:  Wednesday October 18 2018 10:41:49 EDT Ventricular Rate:  85 PR Interval:    QRS Duration: 96 QT Interval:  364 QTC Calculation: 433 R Axis:   -64 Text Interpretation:  Sinus rhythm Left anterior fascicular block Low voltage, precordial leads Consider right ventricular hypertrophy Borderline T abnormalities, inferior leads No significant change since last tracing Confirmed by Gwyneth Sprout ([id number]) on 10/18/2018 10:46:59 AM   Radiology Dg Chest 2 View  Result Date: 10/18/2018 CLINICAL DATA:  Cough, shortness of breath EXAM: CHEST - 2 VIEW COMPARISON:  03/16/2018 FINDINGS: Cardiomegaly. No confluent opacities, effusions or edema. No acute bony abnormality. IMPRESSION: Cardiomegaly.  No active disease. Electronically Signed   By: Charlett Nose M.D.   On: 10/18/2018 11:45   Ct Chest W Contrast  Result Date: 10/18/2018 CLINICAL DATA:  Chest pain and cough with  shortness of breath EXAM: CT CHEST WITH CONTRAST TECHNIQUE: Multidetector CT imaging of the chest was performed during intravenous contrast administration. CONTRAST:  21mL ISOVUE-300 COMPARISON:  Chest x-ray from earlier in the same day, CT from 12/18/2014 FINDINGS: Cardiovascular: Thoracic aorta demonstrates a slight variant pattern with the left vertebral artery arising directly from the aortic arch. No aneurysmal dilatation or dissection is seen. No significant atherosclerotic calcifications are noted. No cardiac enlargement is seen. Minimal coronary calcifications are noted. The pulmonary artery as visualized is within normal limits. No definitive pulmonary emboli are seen. The peripheral inferior branches are  incompletely opacified due to timing of the contrast bolus. Mediastinum/Nodes: The esophagus as visualized is within normal limits. Thoracic inlet is within normal limits. No significant mediastinal or hilar adenopathy is noted. Lungs/Pleura: Lungs are well aerated bilaterally. No pneumothorax or effusion is seen. Diffuse patchy ground-glass opacities are noted throughout both lungs slightly greater on the right than the left in a predominantly peripheral distribution. No sizable nodules are seen. Upper Abdomen: Visualized upper abdomen shows no acute abnormality. Musculoskeletal: Degenerative changes of the thoracic spine are noted. No acute bony abnormality is seen. IMPRESSION: There are a spectrum of findings in the lungs which can be seen with acute atypical infection (as well as other non-infectious etiologies). In particular, viral pneumonia (including COVID-19) should be considered in the appropriate clinical setting. If there is clinical concern for acute pulmonary infection, consultation with Infectious Disease or Pulmonary Medicine is recommend. No other acute abnormality is noted. Critical Value/emergent results were called by telephone at the time of interpretation on 10/18/2018 at 1:18 pm to  [facility], [facility] , who verbally acknowledged these results. Electronically Signed   By: Alcide Clever M.D.   On: 10/18/2018 13:18    Procedures Procedures (including critical care time)  Medications Ordered in ED Medications  azithromycin (ZITHROMAX) 500 mg in sodium chloride 0.9 % 250 mL IVPB (has no administration in time range)  iopamidol (ISOVUE-300) 61 % injection 75 mL (75 mLs Intravenous Contrast Given 10/18/18 1300)  acetaminophen (TYLENOL) tablet 1,000 mg (1,000 mg Oral Given 10/18/18 1343)  cefTRIAXone (ROCEPHIN) 1 g in sodium chloride 0.9 % 100 mL IVPB (0 g Intravenous Stopped 10/18/18 1438)     Initial Impression / Assessment and Plan / ED Course  I have reviewed the triage vital signs and the nursing notes.  Pertinent labs & imaging results that were available during my care of the patient were reviewed by me and considered in my medical decision making (see chart for details).       KERMIT ARNETTE was evaluated in Emergency Department on 10/18/2018 for the symptoms described in the history of present illness. He was evaluated in the context of the global COVID-19 pandemic, which necessitated consideration that the patient might be at risk for infection with the SARS-CoV-2 virus that causes COVID-19. Institutional protocols and algorithms that pertain to the evaluation of patients at risk for COVID-19 are in a state of rapid change based on information released by regulatory bodies including the CDC and federal and state organizations. These policies and algorithms were followed during the patient's care in the ED.   I felt that the patient was a concern for COVID-19.  Initiated universal airborne precautions.  Patient came in with a and 95 mask in place already.  The patient has a concern with his oxygenation as well.  His pulse ox's were maintaining in the low 90s but would drop into the upper 80s.  The patient was placed on 2 L of nasal cannula oxygen.  Final Clinical  Impressions(s) / ED Diagnoses   Final diagnoses:  None    ED Discharge Orders    None       Charlestine Night, PA-C 10/18/18 1444    Gwyneth Sprout, MD 10/18/18 [phone]

## 2018-10-18 NOTE — H&P (Signed)
Internal Medicine Attending H&P note     Date: 10/18/2018               Patient Name:  Andrew Frye MRN: [id number]  DOB: [birth year] Age / Sex: 58 y.o., male   PCP: Verlon Au, MD         Medical Service: Internal Medicine Teaching Service         Attending Physician: Dr. Harmon Dun    First Contact: Dr. Julian Hy Pager: [phone]  Second Contact: Dr. Lorenso Courier Pager: [phone]       After Hours (After 5p/  First Contact Pager: [phone]  weekends / holidays): Second Contact Pager: [phone]   Chief Complaint: Shortness of breath, chest tightness, and subjective fevers  History of Present Illness: Mr. Andrew Frye is a 58 year old male with reported history of pulmonary hypertension who presents with shortness of breath, chest tightness, and subjective fevers over the last week.  He was seen in the urgent care on Sunday (3 days prior to presentation) and was tested for the flu which was negative.  He also underwent a chest x-ray which was reportedly negative.  He reports to me that he started feeling a little unwell about 1 week ago.  He noted that symptoms started with a cough and progressed to a mild fever.  He went to see urgent care[facility] on [address].  He was diagnosed with acute bronchitis.  As noted above there is a reported history of a chest x-ray that was reportedly normal.  Was instructed that he may have the novel coronavirus but testing was not currently available and he was instructed to self isolate until he worsened.  However he reports that overnight he became progressively more short of breath.  He is continued to have some fevers and some chest tightness.  He reports to me that he is overall very healthy he does not see a primary care physician and only goes to urgent care when needed.  There is a reported history of pulmonary hypertension and his EMR however on my evaluation of him he reports that his lungs are healthy.  In the ED he was found to be  tachypneic with respiratory rates up to the 30s and oxygen saturations ranging from high 80s to low 90s.  He is placed on 2 L supplemental oxygen.  Lites unremarkable.  Pertinent labs include hypokalemia with a potassium of 3.3 and unremarkable CBC.  He was given ceftriaxone, azithromycin, and Tylenol in the ED.  Meds:  No current facility-administered medications on file prior to encounter.    Current Outpatient Medications on File Prior to Encounter  Medication Sig  . aspirin EC 81 MG tablet Take 81 mg by mouth daily.  . benzonatate (TESSALON) 100 MG capsule Take 200 mg by mouth every 4 (four) hours as needed for cough.  Marland Kitchen ibuprofen (ADVIL,MOTRIN) 200 MG tablet Take 200 mg by mouth every 6 (six) hours as needed for moderate pain.  Marland Kitchen PROAIR HFA 108 (90 Base) MCG/ACT inhaler Inhale 2 puffs into the lungs every 4 (four) hours as needed for wheezing or shortness of breath.  . cyclobenzaprine (FLEXERIL) 10 MG tablet Take 0.5-1 tablets (5-10 mg total) by mouth 2 (two) times daily as needed for muscle spasms. (Patient not taking: Reported on 03/16/2018)  . ibuprofen (ADVIL,MOTRIN) 600 MG tablet Take 1 tablet (600 mg total) by mouth every 6 (six) hours as needed. (Patient not taking: Reported on 03/16/2018)  . omeprazole (PRILOSEC) 20 MG capsule Take  1 capsule (20 mg total) by mouth daily. (Patient not taking: Reported on 03/16/2018)  Reports to me only using Albuterol inhaler and benzonatate.  Allergies: Allergies as of 10/18/2018 - Review Complete 10/18/2018  Allergen Reaction Noted  . Penicillins Hives 06/01/2011   Past Medical History:  Diagnosis Date  . Arthritis    hands  . Migraines   . Pulmonary hypertension (HCC)     Family History:  Family History  Problem Relation Age of Onset  . Cancer Maternal Grandmother        breast  . Diabetes Mother   . Kidney disease Mother   . Breast cancer Mother     Social History: [name] lives with his wife.  They have 7 children and 15  grandchildren.  He is a never smoker.  He uses alcohol occasionally.  He reports a remote history of cocaine abuse more than 10 years ago.  No other illicit drug use. Recently started a job at [employer] in [employer] [place].  Review of Systems: Review of Systems  Constitutional: Positive for fever and malaise/fatigue. Negative for chills.  HENT: Negative for sore throat.   Eyes: Negative for blurred vision.  Respiratory: Positive for cough and shortness of breath. Negative for sputum production and wheezing.   Cardiovascular: Negative for chest pain and leg swelling.  Gastrointestinal: Negative for abdominal pain, nausea and vomiting.  Musculoskeletal: Negative for myalgias.  Skin: Negative for rash.  Neurological: Negative for dizziness.  All other systems reviewed and are negative.    Physical Exam: Blood pressure 109/67, pulse 82, temperature 99.4 F (37.4 C), temperature source Oral, resp. rate 19, weight 79.4 kg, SpO2 100 %. Physical Exam  Constitutional: He is oriented to person, place, and time and well-developed, well-nourished, and in no distress. No distress.  HENT:  Head: Normocephalic and atraumatic.  Eyes: Conjunctivae are normal.  Cardiovascular: Normal rate, regular rhythm and normal heart sounds.  No murmur heard. Pulmonary/Chest: Effort normal and breath sounds normal. No respiratory distress. He has no wheezes. He has no rales.  Abdominal: Soft. Bowel sounds are normal. There is no abdominal tenderness.  Musculoskeletal:        General: No edema.  Neurological: He is alert and oriented to person, place, and time.  Skin: Skin is warm and dry. He is not diaphoretic.  Psychiatric: Affect normal.  Nursing note and vitals reviewed.    EKG: personally reviewed my interpretation is NSR, no st or twave changes, grossly unchanged from previous 02/2018  CXR: personally reviewed my interpretation no clear pulmonary infiltrate or edema.  Chest CT: FINDINGS: Cardiovascular:  Thoracic aorta demonstrates a slight variant pattern with the left vertebral artery arising directly from the aortic arch. No aneurysmal dilatation or dissection is seen. No significant atherosclerotic calcifications are noted. No cardiac enlargement is seen. Minimal coronary calcifications are noted. The pulmonary artery as visualized is within normal limits. No definitive pulmonary emboli are seen. The peripheral inferior branches are incompletely opacified due to timing of the contrast bolus. Mediastinum/Nodes: The esophagus as visualized is within normal limits. Thoracic inlet is within normal limits. No significant mediastinal or hilar adenopathy is noted. Lungs/Pleura: Lungs are well aerated bilaterally. No pneumothorax or effusion is seen. Diffuse patchy ground-glass opacities are noted throughout both lungs slightly greater on the right than the left in a predominantly peripheral distribution. No sizable nodules are seen. Upper Abdomen: Visualized upper abdomen shows no acute abnormality. Musculoskeletal: Degenerative changes of the thoracic spine are noted. No acute bony  abnormality is seen.   IMPRESSION: There are a spectrum of findings in the lungs which can be seen with acute atypical infection (as well as other non-infectious etiologies). In particular, viral pneumonia (including COVID-19) should be considered in the appropriate clinical setting. If there is clinical concern for acute pulmonary infection, consultation with Infectious Disease or Pulmonary Medicine is recommend. No other acute abnormality is noted.  Assessment & Plan by Problem: This is a 58 year old male with no significant medical comorbidities per his report, who presents with fever worsening shortness of breath and cough and found to have peripheral groundglass opacities on chest CT that is concerning for atypical or viral infection.  Mild O2 desaturations while speaking with the ED provider.     Atypical pneumonia -Normal WBC.  Chest CT is concerning for possible viral pneumonia. -Influenza negative at urgent care -We will check RVP and for novel coronavirus -Given some O2 desaturation while just speaking we will admit to inpatient to monitor for clinical deterioration while we await viral testing -In the interim okay to treat with ceftriaxone and azithromycin for possible community-acquired pneumonia. -Droplet and contact precautions for novel coronavirus    Dispo: Admit patient to Inpatient with expected length of stay greater than 2 midnights.  SignedGust Rung, DO 10/18/2018, 3:45 PM

## 2018-10-18 NOTE — ED Triage Notes (Signed)
Pt in from home with c/o fever, sob, cough and chest tightness x 6 days. States he went to [facility] 4 days ago and was dx with bronchitis, cxr was negative for PNA, sent home w/Tessalon pearls and Albuterol inhaler. Oral temp 99.4 on arrival, states he feels no better and still has dry cough.

## 2018-10-19 ENCOUNTER — Encounter (HOSPITAL_COMMUNITY): Payer: Self-pay | Admitting: Internal Medicine

## 2018-10-19 LAB — CBC
HCT: 38.4 % — ABNORMAL LOW (ref 39.0–52.0)
Hemoglobin: 13.7 g/dL (ref 13.0–17.0)
MCH: 29.4 pg (ref 26.0–34.0)
MCHC: 35.7 g/dL (ref 30.0–36.0)
MCV: 82.4 fL (ref 80.0–100.0)
Platelets: 297 10*3/uL (ref 150–400)
RBC: 4.66 MIL/uL (ref 4.22–5.81)
RDW: 14 % (ref 11.5–15.5)
WBC: 7.9 10*3/uL (ref 4.0–10.5)
nRBC: 0 % (ref 0.0–0.2)

## 2018-10-19 LAB — BASIC METABOLIC PANEL
Anion gap: 8 (ref 5–15)
BUN: 11 mg/dL (ref 6–20)
CO2: 27 mmol/L (ref 22–32)
Calcium: 8.2 mg/dL — ABNORMAL LOW (ref 8.9–10.3)
Chloride: 101 mmol/L (ref 98–111)
Creatinine, Ser: 1.01 mg/dL (ref 0.61–1.24)
GFR calc Af Amer: >60 mL/min (ref >60)
GFR calc non Af Amer: >60 mL/min (ref >60)
Glucose, Bld: 97 mg/dL (ref 70–99)
Potassium: 3.6 mmol/L (ref 3.5–5.1)
Sodium: 136 mmol/L (ref 135–145)

## 2018-10-19 LAB — HIV ANTIBODY (ROUTINE TESTING W REFLEX): HIV Screen 4th Generation wRfx: NONREACTIVE

## 2018-10-19 MED ORDER — AMOXICILLIN 500 MG PO CAPS
500.0000 mg | ORAL_CAPSULE | Freq: Three times a day (TID) | ORAL | Status: DC
  Administered 2018-10-19 – 2018-10-20 (×5): 500 mg via ORAL
  Filled 2018-10-19 (×5): qty 1

## 2018-10-19 MED ORDER — AMOXICILLIN 500 MG PO CAPS: 500.0000 mg | ORAL_CAPSULE | Freq: Three times a day (TID) | ORAL | Status: DC

## 2018-10-19 MED ORDER — AZITHROMYCIN 250 MG PO TABS
500.0000 mg | ORAL_TABLET | Freq: Every day | ORAL | Status: DC
  Administered 2018-10-19 – 2018-10-20 (×2): 500 mg via ORAL
  Filled 2018-10-19 (×2): qty 2

## 2018-10-19 MED ORDER — AZITHROMYCIN 250 MG PO TABS: 500.0000 mg | ORAL_TABLET | Freq: Every day | ORAL | Status: DC

## 2018-10-19 MED ORDER — SODIUM CHLORIDE 0.9 % IV SOLN
INTRAVENOUS | Status: AC
  Administered 2018-10-19: 12:00:00 via INTRAVENOUS

## 2018-10-19 MED ORDER — AMOXICILLIN 250 MG PO CHEW: 875.0000 mg | CHEWABLE_TABLET | Freq: Two times a day (BID) | ORAL | Status: DC

## 2018-10-19 MED ORDER — AMOXICILLIN 250 MG PO CHEW
875.0000 mg | CHEWABLE_TABLET | Freq: Two times a day (BID) | ORAL | Status: DC
  Filled 2018-10-19: qty 4

## 2018-10-19 NOTE — Plan of Care (Signed)
  Problem: Education: Goal: Knowledge of General Education information will improve Description Including pain rating scale, medication(s)/side effects and non-pharmacologic comfort measures Outcome: Progressing   Problem: Clinical Measurements: Goal: Ability to maintain clinical measurements within normal limits will improve Outcome: Progressing Goal: Will remain free from infection Outcome: Progressing Goal: Diagnostic test results will improve Outcome: Progressing Goal: Respiratory complications will improve Outcome: Progressing Goal: Cardiovascular complication will be avoided Outcome: Progressing   Problem: Activity: Goal: Risk for activity intolerance will decrease Outcome: Progressing   Problem: Pain Managment: Goal: General experience of comfort will improve Outcome: Progressing   

## 2018-10-19 NOTE — Progress Notes (Signed)
IM attending note   Subjective: feeling a little better, still having dry cough, increased work of breathing while talking. Did not need supplemental O2 overnight.  Clarified preadmisison medications, was taking albuterol, benzonatate, 81mg  ASA, tylenol/ibuprofen PRN.  Clarified PMH regarding Pulm HTN, was evaluated for this but was told he ultimately did not after catheterization (I confirmed this though careeverwhere, RHC normal, patient does not have known history of pulm HTN).  Objective:  Vital signs in last 24 hours: Vitals:   10/19/18 0038 10/19/18 0338 10/19/18 0341 10/19/18 0830  BP: 101/70 97/66  92/69  Pulse: 66 69    Resp: (!) 22 (!) 24    Temp: 98.1 F (36.7 C) 99.6 F (37.6 C)  98 F (36.7 C)  TempSrc: Oral Oral  Oral  SpO2: 94% 93%  96%  Weight:   74.4 kg   Height:      General: resting in bed, no distress HEENT: no scleral icterus Cardiac: RRR, no rubs, murmurs or gallops Pulm: clear to auscultation bilaterally, moving normal volumes of air, no assc musc use, resp rate increased while talking (30s on monitor) Abd: soft, nontender, nondistended, BS present Ext: warm and well perfused, no pedal edema Neuro: alert and oriented X3  Assessment/Plan:  Principal Problem:   Atypical pneumonia Active Problems:   Acute respiratory distress  Andrew Frye is a 58 year old gentleman with no significant past medical history who presented with S OB, cough, and fevers and found to have peripheral groundglass opacities concerning for atypical versus viral pneumonia.  He was admitted to monitor for gold deterioration while he await viral testing.  Over night his oxygen saturations have remained within normal limits without the need for supplemental oxygen.  He does however have soft blood pressures with SBP in the 80s to 90s with maps of 70s.  Respiratory viral panel negative.  COVID testing pending.  Atypical pneumonia: Initially recieved ceftriaxone and azithromycin treatment  for CAP coverage. - Patient is afebrile, clinically stable, will switch to oral antibiotics including amoxicillin 875 mg twice daily and azithromycin 500 mg daily for an additional 4 days (may be discontinued if novel coronavirus is positive) - Continue droplet and contact precautions - Follow-up Covid testing - Start IV maintenance fluids for soft blood pressure  Dispo: Deferred, may be discharged at earliest tomorrow.  Gust Rung, DO 10/19/2018, 9:14 AM

## 2018-10-20 NOTE — Progress Notes (Signed)
  IM attending progress note Subjective: Afrebrile over night, no new events, reports coughing episode this AM that seemed to clear him out "starting to see the light," feels like he is starting to recover, noted that wife and children are preparing home for him to self isolate.  Objective: Tmax overnight 98.8 RR max 28 O2 sat min 93% on RA. Vital signs in last 24 hours: Vitals:   10/19/18 2000 10/20/18 0023 10/20/18 0525 10/20/18 0532  BP: 95/63 104/63 99/66   Pulse:  (!) 54 (!) 54 (!) 32  Resp:    (!) 21  Temp: 98.8 F (37.1 C) 98.4 F (36.9 C) 98.4 F (36.9 C)   TempSrc: Oral Oral Oral   SpO2:  95% 96% 97%  Weight:    76 kg  Height:      General: resting in bed, no distress or need for supplemental O2 HEENT: no scleral icterus Cardiac: RRR Pulm: clear to auscultation bilaterally, mildly decreased BS Abd: soft, nontender, nondistended, BS present Ext:  no pedal edema   Assessment/Plan:  Principal Problem:   Atypical pneumonia Active Problems:   Acute respiratory distress  Andrew Frye is a 58 year old male who was admitted with atypical versus viral pneumonia.  Overall he has remained stable with normal vital signs and is not using supplemental oxygen.  COVID testing is still pending.    Atypical pneumonia: On day 3 antibiotic treatment for possible community-acquired pneumonia.  Will plan on completing a 5-day total course unless COVID testing is positive.  - Continue oral amoxicillin 500 mg 3 times daily and azithromycin 500 mg daily - Droplet and contact precautions -Follow-up COVID testing, I discussed with him that he is overall doing well, I have a high supsision for COVID, he would like to know results before leaving hospital if possible.  We discsused if results return today we will discusss if he is still feeling well and if he wants to go home he could but would need to self isolate at home for at least 72 hours after his fever (without any antipyretic use) (symptoms  have already been present for 7 days).  I discussed that if negative I would still recommend he follow the same guidelines due to concern that it could be a false negative. - I did discuss hydroxychloroquine with ID, I feel that he is clinically improving, he does not have any comorbidities other than his age of 54.  I feel risk outweight benefits at this point even if the novel coronavirus comes back positive.  Dispo: Anticipated discharge later today versus tomorrow.  Gust Rung, DO 10/20/2018, 8:41 AM

## 2018-10-20 NOTE — Progress Notes (Signed)
Patient became upset because his diet was changed to house tray.  For lunch and when he called nutrition they told him that the whole unit will be getting house trays from now on.  Patient was also mad because he said that the CNA did not change his bed today and he said he had not even seen the CNA.  I took sheets in with me the next time I went in and changed his bed.  Also the director went down to [place] and got him a chicken sandwich and cookie for lunch, he seems much happier now.

## 2018-10-20 NOTE — Plan of Care (Signed)
  Problem: Education: Goal: Knowledge of General Education information will improve Description Including pain rating scale, medication(s)/side effects and non-pharmacologic comfort measures Outcome: Progressing   

## 2018-10-21 ENCOUNTER — Other Ambulatory Visit: Payer: Self-pay

## 2018-10-21 DIAGNOSIS — J1289 Other viral pneumonia: Secondary | ICD-10-CM

## 2018-10-21 DIAGNOSIS — R0603 Acute respiratory distress: Secondary | ICD-10-CM

## 2018-10-21 LAB — NOVEL CORONAVIRUS, NAA (HOSP ORDER, SEND-OUT TO REF LAB; TAT 18-24 HRS): SARS-CoV-2, NAA: DETECTED — AB

## 2018-10-21 MED ORDER — ACETAMINOPHEN 325 MG PO TABS: 650.0000 mg | ORAL_TABLET | Freq: Four times a day (QID) | ORAL | Status: DC | PRN

## 2018-10-21 NOTE — Discharge Summary (Signed)
Name: Andrew Frye MRN: [id number] DOB: 07-06-1961 58 y.o. PCP: Verlon Au, MD  Date of Admission: 10/18/2018 10:31 AM Date of Discharge: 10/21/2018 Attending Physician: Debe Coder, MD  Discharge Diagnosis: 1. COVID-19 pneumonia with respiratory distress   Discharge Medications: Allergies as of 10/21/2018      Reactions   Penicillins Hives   Has patient had a PCN reaction causing immediate rash, facial/tongue/throat swelling, SOB or lightheadedness with hypotension: No Has patient had a PCN reaction causing severe rash involving mucus membranes or skin necrosis: No Has patient had a PCN reaction that required hospitalization: No Has patient had a PCN reaction occurring within the last 10 years: Non If all of the above answers are "NO", then may proceed with Cephalosporin use.      Medication List    STOP taking these medications   cyclobenzaprine 10 MG tablet Commonly known as:  FLEXERIL   ibuprofen 200 MG tablet Commonly known as:  ADVIL,MOTRIN   ibuprofen 600 MG tablet Commonly known as:  ADVIL,MOTRIN   omeprazole 20 MG capsule Commonly known as:  PRILOSEC     TAKE these medications   acetaminophen 325 MG tablet Commonly known as:  TYLENOL Take 2 tablets (650 mg total) by mouth every 6 (six) hours as needed for mild pain (or Fever >/= 101).   aspirin EC 81 MG tablet Take 81 mg by mouth daily.   benzonatate 100 MG capsule Commonly known as:  TESSALON Take 200 mg by mouth every 4 (four) hours as needed for cough.   ProAir HFA 108 (90 Base) MCG/ACT inhaler Generic drug:  albuterol Inhale 2 puffs into the lungs every 4 (four) hours as needed for wheezing or shortness of breath.       Disposition and follow-up:   AndrewAndrew Frye was discharged from [facility] in Good condition.  At the hospital follow up visit please address:  1.  How his respiratory status is, how his exercise tolerance improves after his infection.   2.   Labs / imaging needed at time of follow-up: None except chronic needs  3.  Pending labs/ test needing follow-up: None  Follow-up Appointments:  Advised to follow up with PCP.    Hospital Course by problem list: Principal Problem:   Viral pneumonia Active Problems:   Acute respiratory distress   COVID-19 virus detected   1. Viral pneumonia; COVID-19 which presented with acute respiratory distress.  Andrew Frye is a 58 year old man with limited medical history who presented with SOB, chest tightness and fevers.  He was seen in Urgent Care 3 days prior to admission and tested negative for the flu.  He then started to get worse and he was instructed to self isolate.  He developed more SOB and presented to the hospital.  CT scan of the chest in the ED showed a spectrum of findings which can be seen with atypical infection.  He was tested for COVID-19 and was positive.  He initially required oxygen and this was able to be weaned off.  He was initially treated with Amoxicillin and Azithromycin, but this was stopped when his COVID-19 testing was positive.  The team discussed hydroxychloroquine with the ID consultant and it was not deemed necessary.  He was outside the 7 days quarantine when discharged, but on anti-pyretics so fever curve was unknown.  He opted to self isolate for 3 more days at home.    Discharge Vitals:   BP 109/74 (BP Location:  Left Arm)   Pulse 61   Temp 98.1 F (36.7 C) (Oral)   Resp 18   Ht 5\' 8"  (1.727 m)   Wt 75.9 kg   SpO2 96%   BMI 25.44 kg/m   Pertinent Labs, Studies, and Procedures:  COVID 19 +  Discharge Instructions: Discharge Instructions    Call MD for:  difficulty breathing, headache or visual disturbances   Complete by:  As directed    Call MD for:  extreme fatigue   Complete by:  As directed    Call MD for:  persistant dizziness or light-headedness   Complete by:  As directed    Call MD for:  persistant nausea and vomiting   Complete by:  As directed     Call MD for:  severe uncontrolled pain   Complete by:  As directed    Call MD for:  temperature >100.4   Complete by:  As directed    Diet general   Complete by:  As directed    Discharge instructions   Complete by:  As directed    CDC guidelines for COVID positive isolation  Stay home except to get medical care  Stay home: Most people with COVID-19 have mild illness and are able to recover at home without medical care. Do not leave your home, except to get medical care. Do not visit public areas.  Stay in touch with your doctor. Call before you get medical care. Be sure to get care if you have trouble breathing, or have any other emergency warning signs, or if you think it is an emergency.  Avoid public transportation: Avoid using public transportation, ride-sharing, or taxis.  Separate yourself from other people in your home, this is known as home isolation  Stay away from others: As much as possible, you stay away from others. You should stay in a specific "sick room" if possible, and away from other people in your home. Use a separate bathroom, if available.  Call ahead before visiting your doctor  Call ahead: Many medical visits for routine care are being postponed or done by phone or telemedicine. If you have a medical appointment that cannot be postponed, call your doctor's office, and tell them you have or may have COVID-19. This will help the office protect themselves and other patients.  If you are sick wear a cloth covering over your nose and mouth  You should wear a cloth face covering, over your nose and mouth if you must be around other people even at home).  Note: During the COVID-19 pandemic, medical grade facemasks are reserved for healthcare workers and some first responders. You may need to improvise a cloth face covering using a scarf or bandana.  Cover your coughs and sneezes Cover: Cover your mouth and nose with a tissue when you cough or sneeze. Dispose:  Throw used tissues in a lined trash can. Wash hands: Immediately wash your hands with soap and water for at least 20 seconds. If soap and water are not available, clean your hands with an alcohol-based hand sanitizer that contains at least 60% alcohol.  Clean your hands often Wash hands: Wash your hands often with soap and water for at least 20 seconds. This is especially important after blowing your nose, coughing, or sneezing; going to the bathroom; and before eating or preparing food. Hand sanitizer: If soap and water are not available, use an alcohol-based hand sanitizer with at least 60% alcohol, covering all surfaces of your hands and rubbing  them together until they feel dry. Soap and water: Soap and water are the best option, especially if hands are visibly dirty. Avoid touching: Avoid touching your eyes, nose, and mouth with unwashed hands.  Avoid sharing personal household items Do not share: Do not share dishes, drinking glasses, cups, eating utensils, towels, or bedding with other people in your home. Wash thoroughly after use: After using these items, wash them thoroughly with soap and water or put in the dishwasher. cleaning a counter  Clean all "high-touch" surfaces everyday Clean high-touch surfaces in your isolation area ("sick room" and bathroom) every day; let a caregiver clean and disinfect high-touch surfaces in other areas of the home.  If a caregiver or other person needs to clean and disinfect a sick person's bedroom or bathroom, they should do so on an as-needed basis. The caregiver/other person should wear a mask and wait as long as possible after the sick person has used the bathroom.  High-touch surfaces include phones, remote controls, counters, tabletops, doorknobs, bathroom fixtures, toilets, keyboards, tablets, and bedside tables.  Clean and disinfect areas that may have blood, stool, or body fluids on them. Household cleaners and disinfectants: Clean the area or  item with soap and water or another detergent if it is dirty. Then, use a household disinfectant. Be sure to follow the instructions on the label to ensure safe and effective use of the product. Many products recommend keeping the surface wet for several minutes to ensure germs are killed. Many also recommend precautions such as wearing gloves and making sure you have good ventilation during use of the product. Most EPA-registered household disinfectants should be effective. A full list of disinfectants can be found hereexternal icon.  Monitor your symptoms Common symptoms of COVID-19 include fever and cough. Trouble breathing is a more serious symptom that means you should get medical attention. If you are having trouble breathing, seek medical attention, but call first. Call your doctor or emergency room before going in and tell them your symptoms. They will tell you what to do.  Wear a cloth face covering (covers your nose and mouth): Put on the cloth face covering when you leave your house or when around other people. You don't need to wear the cloth face covering if you are alone. If you can't put on a cloth face covering (because of trouble breathing for example), cover your coughs and sneezes in some other way. Try to stay at least 6 feet away from other people. This will help protect the people around you.  Follow care instructions from your healthcare provider and local health department: Your local health authorities may give instructions on checking your symptoms and reporting information.  You can stop home isolation:  You have had no fever for at least 72 hours (that is three full days of no fever without the use medicine that reduces fevers) AND other symptoms have improved (for example, when your cough or shortness of breath have improved) AND at least 7 days have passed since your symptoms first appeared.   Increase activity slowly   Complete by:  As directed       Signed:  Inez Catalina, MD 10/21/2018, 12:09 PM

## 2018-10-21 NOTE — Progress Notes (Signed)
  Date: 10/21/2018  Patient name: Andrew Frye  Medical record number: [id number]  Date of birth: September 20, 1960   Subjective: Patient reports feeling well.  His cough is much improved.  His SOB is better.  He is not requiring oxygen.  He is about 9-10 days out from start of symptoms (October 12, 2018).  He has not had a fever, but has been taking intermittent tylenol.  He was comforted that his test is positive and will therefore make him more careful to protect his family. We discussed quarantine, return to work and [employer] guidelines.  He felt comfortable with his discharge.     Objective:  Vital signs in last 24 hours: Vitals:   10/21/18 0500 10/21/18 0700 10/21/18 0734 10/21/18 1155  BP:   99/70 109/74  Pulse: 61 68 72 61  Resp: (!) 28 19 (!) 31 18  Temp:      TempSrc:      SpO2: 92% 95% 95% 96%  Weight:      Height:       Physical Exam:  General: Well developed, sitting up in bed, no distress Eyes: Anicteric sclerae, wearing glasses CV: RR, NR, no murmur Pulm: Faint crackles throughout, no wheezing Abd: Soft, NT, ND Ext: Thin, no edema  Assessment/Plan:  Viral pneumonia due to COVID-19 virus  - COVID testing + today, antibiotics stopped based on previous plan by Dr. Mikey Bussing - Droplet and contact precautions - Discussed continued isolation at home at length today.  He is 9 days out from first symptoms, however, he has been taking tylenol and is not sure if he is truly fever free, all other symptoms are improving.  He has opted to remain isolated for 72 more hours, and then return to his family after that and to work on 10/26/18.  I have provided him a work note.   Dispo: Anticipated discharge today  Inez Catalina, MD 10/21/2018, 12:09 PM

## 2019-06-25 ENCOUNTER — Other Ambulatory Visit: Payer: Self-pay

## 2019-06-25 DIAGNOSIS — Z20822 Contact with and (suspected) exposure to covid-19: Secondary | ICD-10-CM

## 2019-06-26 LAB — NOVEL CORONAVIRUS, NAA: SARS-CoV-2, NAA: NOT DETECTED

## 2019-12-24 IMAGING — CT CT CHEST WITH CONTRAST
2 of 3 series · 15 of 36 positions shown, 18 images · IV contrast (Omni 300)
Comparison: Chest x-ray from earlier in the same day, CT from
12/18/2014

CLINICAL DATA: Chest pain and cough with shortness of breath

EXAM:
CT CHEST WITH CONTRAST
TECHNIQUE: Multidetector CT imaging of the chest was performed during
intravenous contrast administration.
CONTRAST:  75mL [id number]

[Series 3: chest with 2mm st · axial · 0.76mm/px · z∈[-214,+72]mm · 12 of 169 slices shown, 15 images]
[im 13/169  mediastinal]
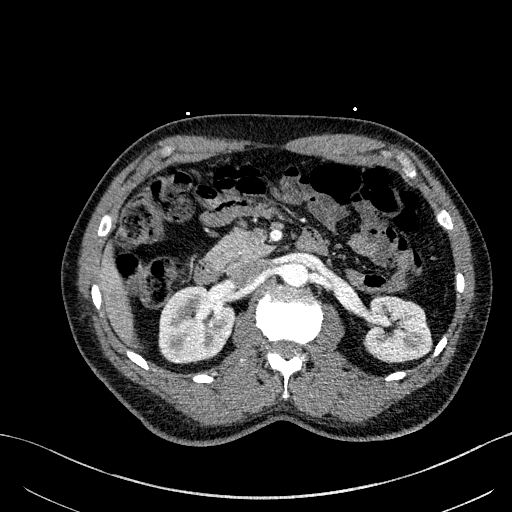
[im 13/169  lung]
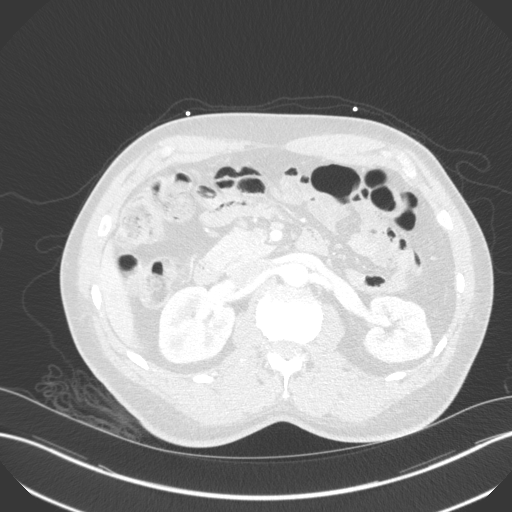
[im 25/169  lung]
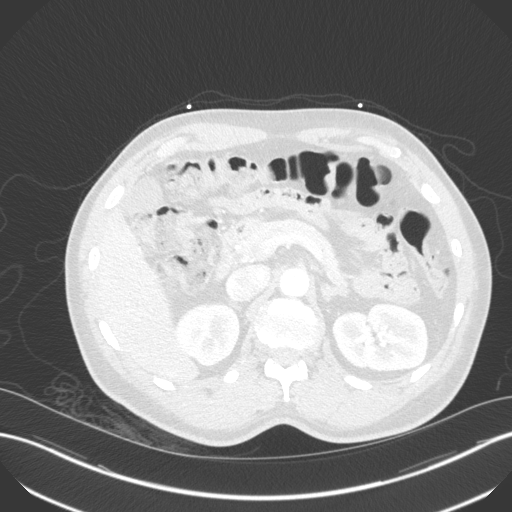
[im 38/169  lung]
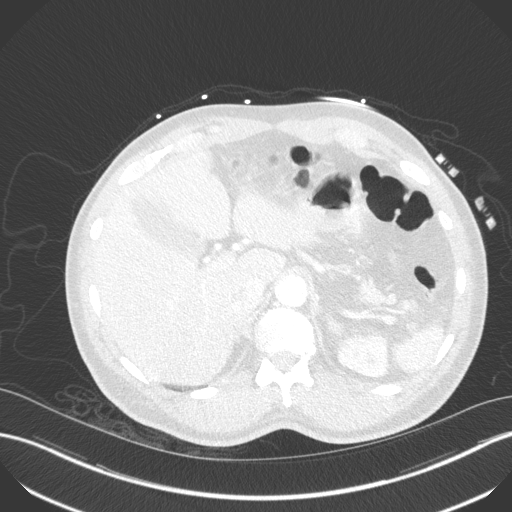
[im 50/169  lung]
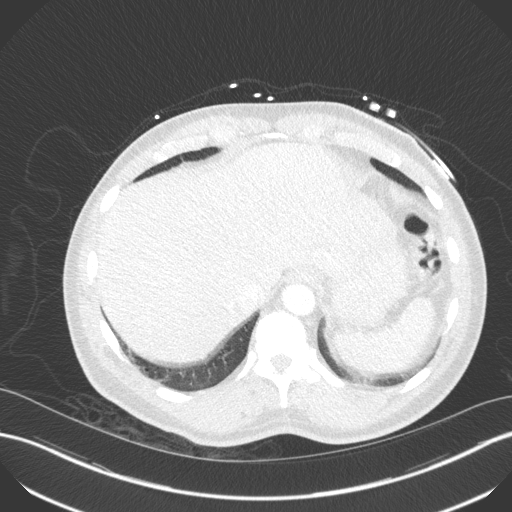
[im 63/169  mediastinal]
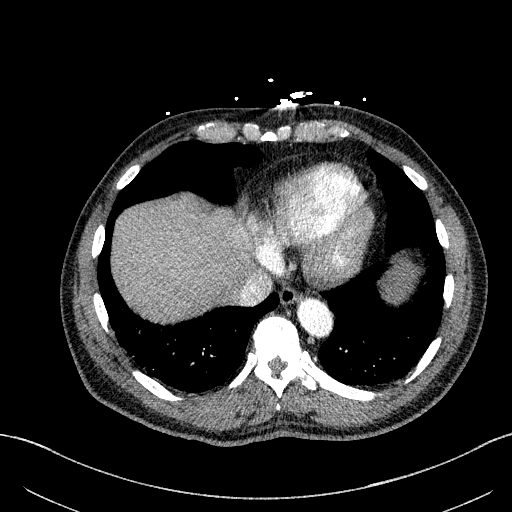
[im 63/169  lung]
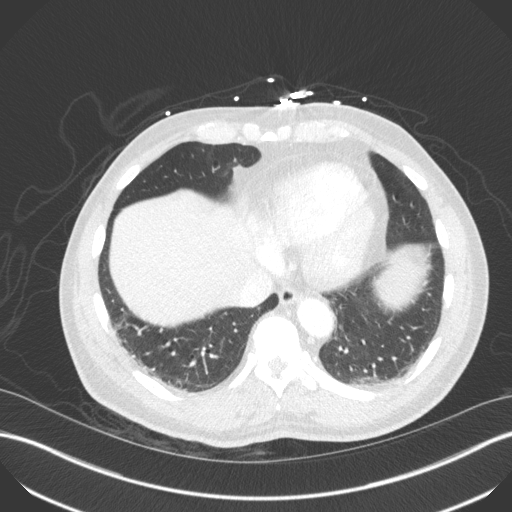
[im 75/169  lung]
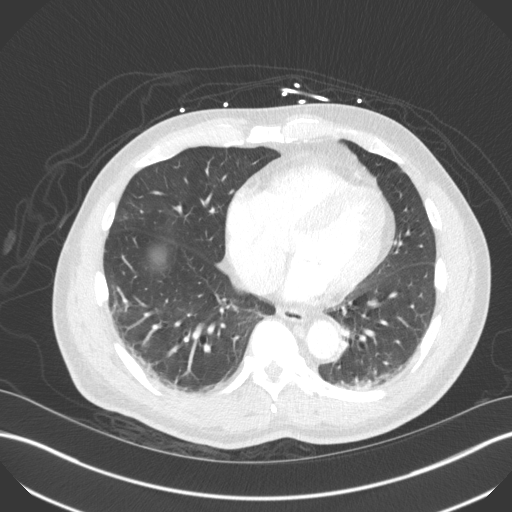
[im 94/169  lung]
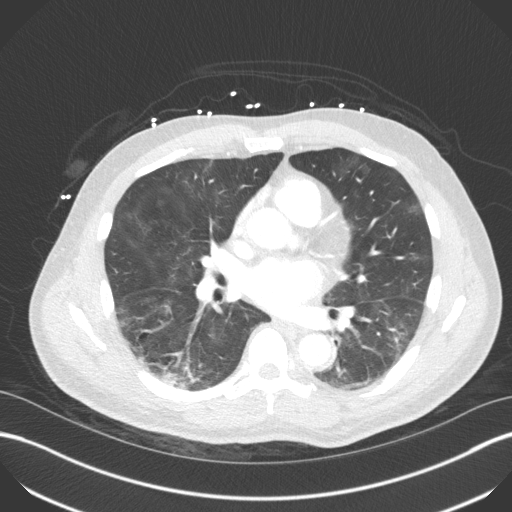
[im 106/169  lung]
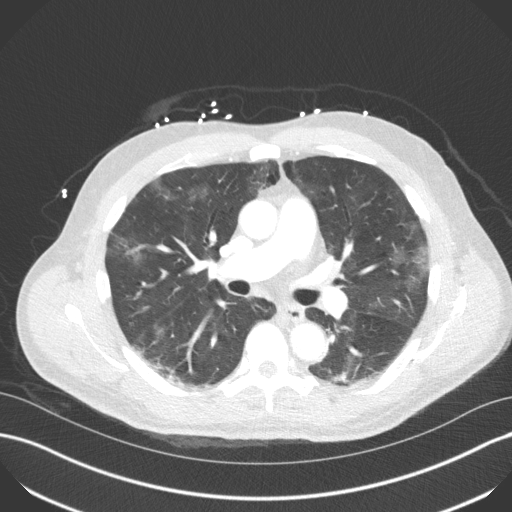
[im 119/169  mediastinal]
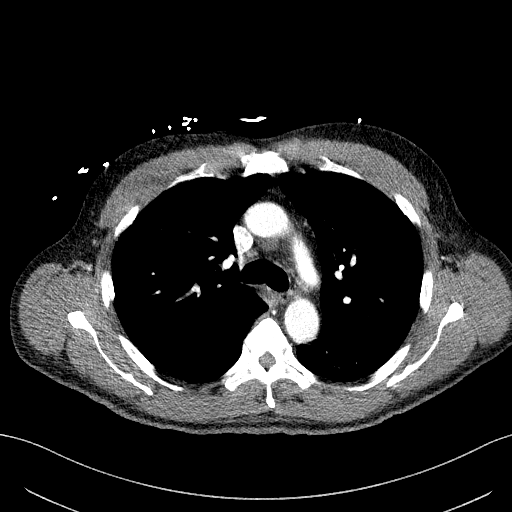
[im 119/169  lung]
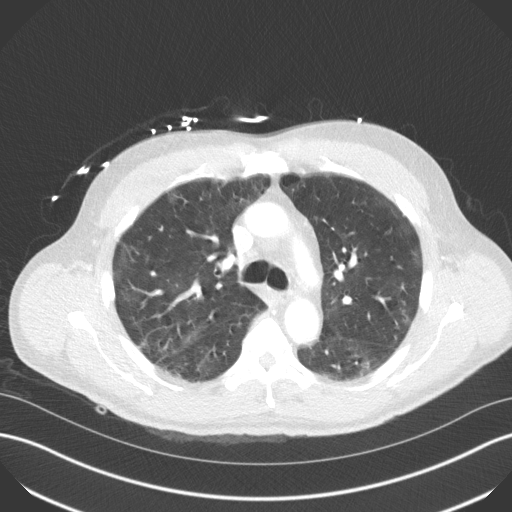
[im 131/169  lung]
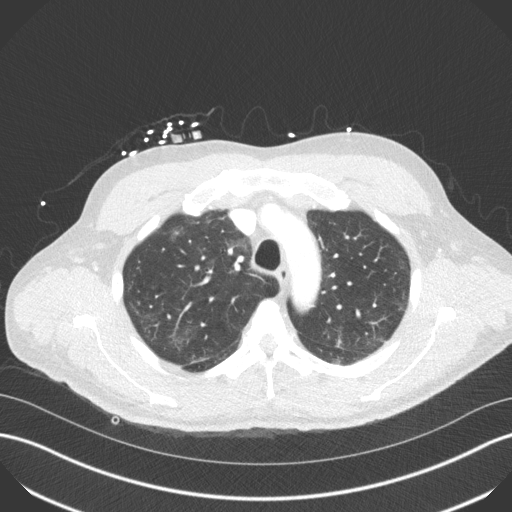
[im 144/169  lung]
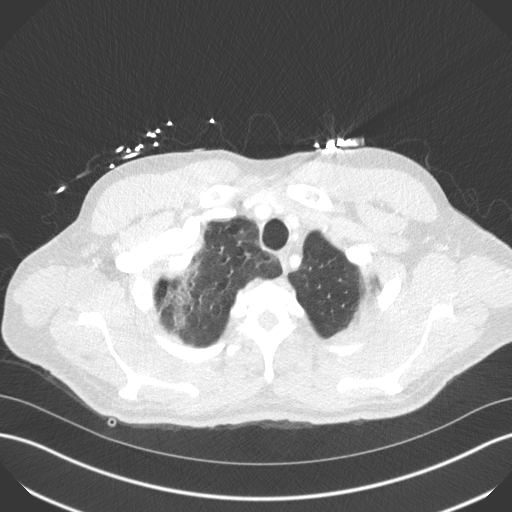
[im 156/169  lung]
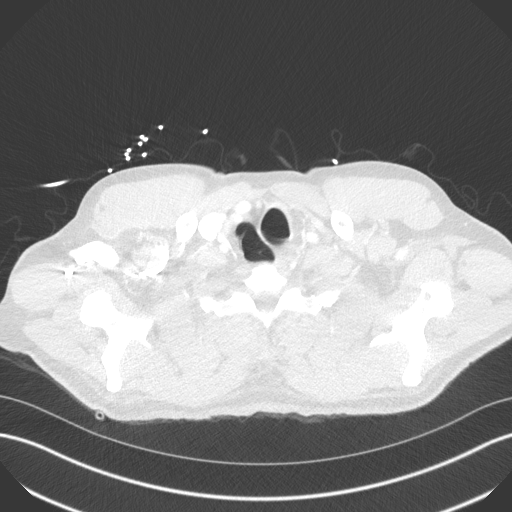

[Series 5: chest with 3mm st cor · coronal · 0.72mm/px · 3 of 99 slices shown]
[im 20/99  lung]
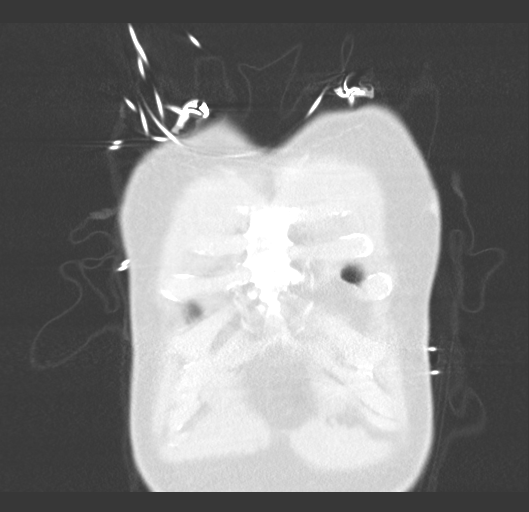
[im 40/99  lung]
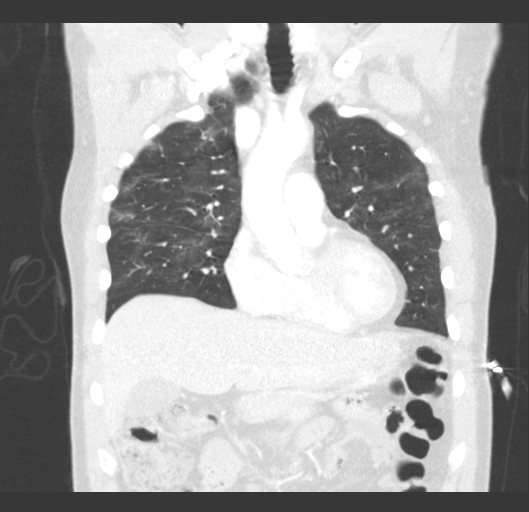
[im 59/99  lung]
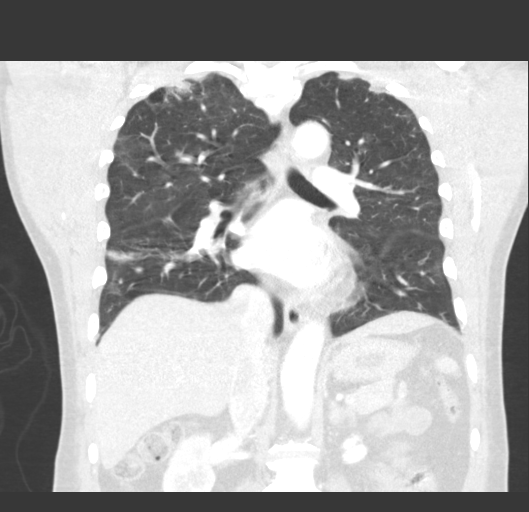

[15 of 36 positions shown; findings below may reference images not displayed]

FINDINGS: Cardiovascular: Thoracic aorta demonstrates a slight variant pattern
with the left vertebral artery arising directly from the aortic
arch. No aneurysmal dilatation or dissection is seen. No significant
atherosclerotic calcifications are noted. No cardiac enlargement is
seen. Minimal coronary calcifications are noted. The pulmonary
artery as visualized is within normal limits. No definitive
pulmonary emboli are seen. The peripheral inferior branches are
incompletely opacified due to timing of the contrast bolus.

Mediastinum/Nodes: The esophagus as visualized is within normal
limits. Thoracic inlet is within normal limits. No significant
mediastinal or hilar adenopathy is noted.

Lungs/Pleura: Lungs are well aerated bilaterally. No pneumothorax or
effusion is seen. Diffuse patchy ground-glass opacities are noted
throughout both lungs slightly greater on the right than the left in
a predominantly peripheral distribution. No sizable nodules are
seen.

Upper Abdomen: Visualized upper abdomen shows no acute abnormality.

Musculoskeletal: Degenerative changes of the thoracic spine are
noted. No acute bony abnormality is seen.
IMPRESSION: There are a spectrum of findings in the lungs which can be seen with
acute atypical infection (as well as other non-infectious
etiologies). In particular, viral pneumonia (including [id number])
should be considered in the appropriate clinical setting. If there
is clinical concern for acute pulmonary infection, consultation with
Infectious Disease or Pulmonary Medicine is recommend.

No other acute abnormality is noted.

Critical Value/emergent results were called by telephone at the time
of interpretation on 10/18/2018 at [DATE] to FERIENHAUS ERXLEBEN, PA ,
who verbally acknowledged these results.

## 2020-03-25 ENCOUNTER — Encounter (HOSPITAL_COMMUNITY): Payer: Self-pay | Admitting: Emergency Medicine

## 2020-03-25 ENCOUNTER — Emergency Department (HOSPITAL_COMMUNITY): Payer: BLUE CROSS/BLUE SHIELD

## 2020-03-25 ENCOUNTER — Observation Stay (HOSPITAL_COMMUNITY)
Admission: EM | Admit: 2020-03-25 | Discharge: 2020-03-26 | Disposition: A | Discharge status: Home / Self Care | Payer: BLUE CROSS/BLUE SHIELD | Attending: Family Medicine | Admitting: Family Medicine

## 2020-03-25 ENCOUNTER — Other Ambulatory Visit: Payer: Self-pay

## 2020-03-25 DIAGNOSIS — I2699 Other pulmonary embolism without acute cor pulmonale: Secondary | ICD-10-CM | POA: Diagnosis not present

## 2020-03-25 DIAGNOSIS — Z87891 Personal history of nicotine dependence: Secondary | ICD-10-CM | POA: Insufficient documentation

## 2020-03-25 DIAGNOSIS — Z20822 Contact with and (suspected) exposure to covid-19: Secondary | ICD-10-CM | POA: Diagnosis not present

## 2020-03-25 DIAGNOSIS — R079 Chest pain, unspecified: Secondary | ICD-10-CM | POA: Diagnosis present

## 2020-03-25 DIAGNOSIS — Z79899 Other long term (current) drug therapy: Secondary | ICD-10-CM | POA: Diagnosis not present

## 2020-03-25 LAB — BASIC METABOLIC PANEL
Anion gap: 15 (ref 5–15)
BUN: 8 mg/dL (ref 8–23)
CO2: 18 mmol/L — ABNORMAL LOW (ref 22–32)
Calcium: 8.8 mg/dL — ABNORMAL LOW (ref 8.9–10.3)
Chloride: 105 mmol/L (ref 98–111)
Creatinine, Ser: 0.9 mg/dL (ref 0.61–1.24)
GFR calc Af Amer: >60 mL/min (ref >60)
GFR calc non Af Amer: >60 mL/min (ref >60)
Glucose, Bld: 97 mg/dL (ref 70–99)
Potassium: 3.5 mmol/L (ref 3.5–5.1)
Sodium: 138 mmol/L (ref 135–145)

## 2020-03-25 LAB — TROPONIN I (HIGH SENSITIVITY)
Troponin I (High Sensitivity): 12 ng/L (ref <18)
Troponin I (High Sensitivity): 12 ng/L (ref <18)

## 2020-03-25 LAB — CBC
HCT: 44.1 % (ref 39.0–52.0)
Hemoglobin: 15.1 g/dL (ref 13.0–17.0)
MCH: 29.3 pg (ref 26.0–34.0)
MCHC: 34.2 g/dL (ref 30.0–36.0)
MCV: 85.6 fL (ref 80.0–100.0)
Platelets: 271 10*3/uL (ref 150–400)
RBC: 5.15 MIL/uL (ref 4.22–5.81)
RDW: 16.1 % — ABNORMAL HIGH (ref 11.5–15.5)
WBC: 9.2 10*3/uL (ref 4.0–10.5)
nRBC: 0 % (ref 0.0–0.2)

## 2020-03-25 LAB — HEPARIN LEVEL (UNFRACTIONATED): Heparin Unfractionated: 0.59 IU/mL (ref 0.30–0.70)

## 2020-03-25 LAB — SARS CORONAVIRUS 2 BY RT PCR (HOSPITAL ORDER, PERFORMED IN ~~LOC~~ HOSPITAL LAB): SARS Coronavirus 2: NEGATIVE

## 2020-03-25 MED ORDER — ALBUTEROL SULFATE HFA 108 (90 BASE) MCG/ACT IN AERS
2.0000 | INHALATION_SPRAY | Freq: Once | RESPIRATORY_TRACT | Status: AC
  Administered 2020-03-25: 2 via RESPIRATORY_TRACT
  Filled 2020-03-25: qty 6.7

## 2020-03-25 MED ORDER — ACETAMINOPHEN 325 MG PO TABS: 650.0000 mg | ORAL_TABLET | Freq: Four times a day (QID) | ORAL | Status: DC | PRN

## 2020-03-25 MED ORDER — ONDANSETRON HCL 4 MG/2ML IJ SOLN
4.0000 mg | Freq: Once | INTRAMUSCULAR | Status: AC
  Administered 2020-03-25: 4 mg via INTRAVENOUS
  Filled 2020-03-25: qty 2

## 2020-03-25 MED ORDER — IOHEXOL 350 MG/ML SOLN
75.0000 mL | Freq: Once | INTRAVENOUS | Status: AC | PRN
  Administered 2020-03-25: 75 mL via INTRAVENOUS

## 2020-03-25 MED ORDER — HEPARIN BOLUS VIA INFUSION
4500.0000 [IU] | Freq: Once | INTRAVENOUS | Status: AC
  Administered 2020-03-25: 4500 [IU] via INTRAVENOUS
  Filled 2020-03-25: qty 4500

## 2020-03-25 MED ORDER — MORPHINE SULFATE (PF) 4 MG/ML IV SOLN
4.0000 mg | Freq: Once | INTRAVENOUS | Status: AC
  Administered 2020-03-25: 4 mg via INTRAVENOUS
  Filled 2020-03-25: qty 1

## 2020-03-25 MED ORDER — HEPARIN (PORCINE) 25000 UT/250ML-% IV SOLN
1300.0000 [IU]/h | INTRAVENOUS | Status: DC
  Administered 2020-03-25: 1300 [IU]/h via INTRAVENOUS
  Filled 2020-03-25 (×3): qty 250

## 2020-03-25 MED ORDER — KETOROLAC TROMETHAMINE 30 MG/ML IJ SOLN
30.0000 mg | Freq: Once | INTRAMUSCULAR | Status: AC
  Administered 2020-03-25: 30 mg via INTRAVENOUS
  Filled 2020-03-25: qty 1

## 2020-03-25 MED ORDER — AEROCHAMBER PLUS FLO-VU LARGE MISC: 1.0000 | Freq: Once | Status: DC

## 2020-03-25 NOTE — ED Provider Notes (Signed)
[facility] EMERGENCY DEPARTMENT Provider Note   CSN: [id number] Arrival date & time: 03/25/20  3810     History Chief Complaint  Patient presents with   Chest Pain    Andrew Frye is a 59 y.o. male.  Pt presents to the ED today with left sided CP.  Pt said he's had the pain intermittently for a month.  He has also not felt like his breathing has been right since he had Covid in April of 2020.  Pt denies any f/c.  He said the pain comes and goes.  Sob worse when pain comes.        Past Medical History:  Diagnosis Date   Arthritis    hands   Migraines     Patient Active Problem List   Diagnosis Date Noted   COVID-19 virus detected 10/21/2018   Viral pneumonia 10/18/2018   Acute respiratory distress 10/18/2018   Acute respiratory infection    Arthritis pain, hand 06/01/2011   Headache(784.0) 06/01/2011   Dental cavity 06/01/2011    History reviewed. No pertinent surgical history.     Family History  Problem Relation Age of Onset   Cancer Maternal Grandmother        breast   Diabetes Mother    Kidney disease Mother    Breast cancer Mother     Social History   Tobacco Use   Smoking status: Former Smoker    Types: Cigarettes    Start date: 02/15/1972    Quit date: 03/29/2000    Years since quitting: 20.0   Smokeless tobacco: Never Used  Vaping Use   Vaping Use: Never used  Substance Use Topics   Alcohol use: Yes    Comment: occasionally   Drug use: No    Comment: Patient has a history of cocaine abuse more than 10 years ago    Home Medications Prior to Admission medications   Medication Sig Start Date End Date Taking? Authorizing Provider  acetaminophen (TYLENOL) 500 MG tablet Take 500 mg by mouth every 6 (six) hours as needed for moderate pain.   Yes [provider]  acetaminophen (TYLENOL) 325 MG tablet Take 2 tablets (650 mg total) by mouth every 6 (six) hours as needed for mild pain (or Fever >/=  101). Patient not taking: Reported on 03/25/2020 10/21/18   Inez Catalina, MD    Allergies    Penicillins  Review of Systems   Review of Systems  Respiratory: Positive for shortness of breath.   Cardiovascular: Positive for chest pain.  All other systems reviewed and are negative.   Physical Exam Updated Vital Signs BP 140/81    Pulse 69    Temp 98.1 F (36.7 C) (Oral)    Resp 17    Ht 5\' 8"  (1.727 m)    Wt 81.6 kg    SpO2 96%    BMI 27.37 kg/m   Physical Exam Vitals and nursing note reviewed.  Constitutional:      Appearance: He is well-developed.  HENT:     Head: Normocephalic and atraumatic.  Cardiovascular:     Rate and Rhythm: Normal rate and regular rhythm.     Heart sounds: Normal heart sounds.  Pulmonary:     Effort: Pulmonary effort is normal.     Breath sounds: Normal breath sounds.  Chest:    Abdominal:     General: Bowel sounds are normal.     Palpations: Abdomen is soft.  Musculoskeletal:  General: Normal range of motion.     Cervical back: Normal range of motion and neck supple.  Skin:    General: Skin is warm and dry.     Capillary Refill: Capillary refill takes less than 2 seconds.  Neurological:     General: No focal deficit present.     Mental Status: He is alert and oriented to person, place, and time.     ED Results / Procedures / Treatments   Labs (all labs ordered are listed, but only abnormal results are displayed) Labs Reviewed  BASIC METABOLIC PANEL - Abnormal; Notable for the following components:      Result Value   CO2 18 (*)    Calcium 8.8 (*)    All other components within normal limits  CBC - Abnormal; Notable for the following components:   RDW 16.1 (*)    All other components within normal limits  SARS CORONAVIRUS 2 BY RT PCR (HOSPITAL ORDER, PERFORMED IN [facility])  HEPARIN LEVEL (UNFRACTIONATED)  TROPONIN I (HIGH SENSITIVITY)  TROPONIN I (HIGH SENSITIVITY)    EKG EKG  Interpretation  Date/Time:  Tuesday March 25 2020 11:26:09 EDT Ventricular Rate:  68 PR Interval:    QRS Duration: 104 QT Interval:  449 QTC Calculation: 478 R Axis:   -62 Text Interpretation: Sinus rhythm Left anterior fascicular block Low voltage, precordial leads Abnormal R-wave progression, early transition Borderline prolonged QT interval No significant change since last tracing Confirmed by Jacalyn Lefevre [phone]) on 03/25/2020 11:30:03 AM   Radiology DG Chest 2 View  Result Date: 03/25/2020 CLINICAL DATA:  Chest pain.  Shortness of breath. EXAM: CHEST - 2 VIEW COMPARISON:  CT 10/18/2018.  Chest x-ray 10/18/2018. FINDINGS: Mediastinum hilar structures normal. No focal infiltrate. Mild bibasilar subsegmental atelectasis and or scarring. No pleural effusion or pneumothorax. Deformity noted the left anterior second rib, possibly secondary to old rib fracture. IMPRESSION: Mild basilar subsegmental atelectasis and or scarring. Exam otherwise unremarkable. Electronically Signed   By: Maisie Fus  Register   On: 03/25/2020 07:55   CT Angio Chest PE W and/or Wo Contrast  Result Date: 03/25/2020 CLINICAL DATA:  Shortness of breath and chest pain EXAM: CT ANGIOGRAPHY CHEST WITH CONTRAST TECHNIQUE: Multidetector CT imaging of the chest was performed using the standard protocol during bolus administration of intravenous contrast. Multiplanar CT image reconstructions and MIPs were obtained to evaluate the vascular anatomy. CONTRAST:  73mL OMNIPAQUE IOHEXOL 350 MG/ML SOLN COMPARISON:  Chest radiograph March 25, 2020; chest CT October 18, 2018. FINDINGS: Cardiovascular: There are pulmonary emboli in several right upper lobe, right middle lobe, and right lower lobe branches. There is an incompletely obstructing pulmonary embolus in the distal left main pulmonary artery. There also pulmonary emboli in lingular branches on the left. Right ventricle to left ventricular diameter is less than 0.9, not indicative of  right heart strain. There is no thoracic aortic aneurysm or dissection. Visualized great vessels appear unremarkable. There is slight aortic atherosclerosis. No pericardial effusion or pericardial thickening. Mediastinum/Nodes: Visualized thyroid is unremarkable. There is no appreciable thoracic adenopathy. No esophageal lesions are evident. Lungs/Pleura: There are small bullae in the medial right upper lobe. There are scattered areas of atelectatic change on the right. There is no edema or airspace opacity. No appreciable pleural effusions. Upper Abdomen: There is hepatic steatosis. Visualized upper abdominal structures otherwise appear unremarkable. Musculoskeletal: There are foci of degenerative change in the thoracic spine. No blastic or lytic bone lesions. No chest wall lesions. Review of  the MIP images confirms the above findings. IMPRESSION: 1. Multiple foci of pulmonary embolus bilaterally without appreciable right heart strain. 2. Slight aortic atherosclerosis. No thoracic aortic aneurysm or dissection. 3. Lower lung region atelectatic change. No edema or airspace opacity. 4.  No evident adenopathy. 5.  Hepatic steatosis. Aortic Atherosclerosis (ICD10-I70.0). Critical Value/emergent results were called by telephone at the time of interpretation on 03/25/2020 at 1:39 pm to provider Mayleigh Tetrault , who verbally acknowledged these results. Electronically Signed   By: Bretta Bang III M.D.   On: 03/25/2020 13:39    Procedures Procedures (including critical care time)  Medications Ordered in ED Medications  AeroChamber Plus Flo-Vu Large MISC 1 each (1 each Other Not Given 03/25/20 1153)  heparin ADULT infusion 100 units/mL (25000 units/269mL sodium chloride 0.45%) (1,300 Units/hr Intravenous New Bag/Given 03/25/20 1409)  ketorolac (TORADOL) 30 MG/ML injection 30 mg (30 mg Intravenous Given 03/25/20 1126)  albuterol (VENTOLIN HFA) 108 (90 Base) MCG/ACT inhaler 2 puff (2 puffs Inhalation Given 03/25/20  1127)  iohexol (OMNIPAQUE) 350 MG/ML injection 75 mL (75 mLs Intravenous Contrast Given 03/25/20 1322)  morphine 4 MG/ML injection 4 mg (4 mg Intravenous Given 03/25/20 1351)  ondansetron (ZOFRAN) injection 4 mg (4 mg Intravenous Given 03/25/20 1351)  heparin bolus via infusion 4,500 Units (4,500 Units Intravenous Bolus from Bag 03/25/20 1409)    ED Course  I have reviewed the triage vital signs and the nursing notes.  Pertinent labs & imaging results that were available during my care of the patient were reviewed by me and considered in my medical decision making (see chart for details).    MDM Rules/Calculators/A&P                          Pt has never had a DVT or PE in the past.  He does have a hx of Covid, but that was over a year ago.  Pt does have significant clot burden on CT scan.  No right heart strain and normal troponins.  I ordered a hypercoagulable panel as this was unprovoked.  However, heparin was started prior to this getting drawn, so the panel was cancelled.  Pt d/w FP resident service for admission.  CRITICAL CARE Performed by: Jacalyn Lefevre   Total critical care time: 30 minutes  Critical care time was exclusive of separately billable procedures and treating other patients.  Critical care was necessary to treat or prevent imminent or life-threatening deterioration.  Critical care was time spent personally by me on the following activities: development of treatment plan with patient and/or surrogate as well as nursing, discussions with consultants, evaluation of patient's response to treatment, examination of patient, obtaining history from patient or surrogate, ordering and performing treatments and interventions, ordering and review of laboratory studies, ordering and review of radiographic studies, pulse oximetry and re-evaluation of patient's condition.  Final Clinical Impression(s) / ED Diagnoses Final diagnoses:  Bilateral pulmonary embolism [facility])    Rx / DC  Orders ED Discharge Orders    None       Jacalyn Lefevre, MD 03/25/20 1445

## 2020-03-25 NOTE — ED Notes (Signed)
Attempted report x1. 

## 2020-03-25 NOTE — H&P (Addendum)
Family Medicine Teaching [facility] Admission History and Physical Service Pager: [phone]  Patient name: Andrew Frye Medical record number: [id number] Date of birth: 1961/02/17 Age: 59 y.o. Gender: male  Primary Care Provider: Verlon Au, MD Consultants:  Code Status: FULL Preferred Emergency Contact: Dorita Fray (wife)  Chief Complaint: Chest pain  Assessment and Plan: ROBET CRUTCHFIELD is a 59 y.o. male presenting with chest pain, found to have bilateral pulmonary emboli. PMH is significant for osteoarthritis and mild HLD.  Bilateral Pulmonary Emboli, Unprovoked Patient presenting with intermittent left sided chest pain for approx. 6 months, but significantly worse this morning. Currently hemodynamically stable with normal vital signs (HR 71, BP 136/93, RR 18, O2 sat 95% on room air). Labs in the ED showed CBC and BMP within normal limits, troponin 12>12. EKG normal sinus rhythm with LAD, left anterior block, calculated QTc 428. CXR demonstrated mild bibasilar atelectasis and/or scarring but otherwise unremarkable. CTA revealed bilateral pulmonary emboli in right upper, middle and lower lobe branches as well as left main pulmonary atery and lingular branches. No evidence of right heart strain. No risk factors for DVT/PE on history, so leading diagnosis is unprovoked bilateral pulmonary emboli. However, given hx of chest pain, differential diagnosis also includes MI, stable/unstable angina, pericarditis, musculoskeletal pain, CHF, although much less likely given imaging and lab work thus far. COVID test pending. In the ED, patient received Toradol 30mg  IV x1, 2 puffs of Albuterol, Morphine 4mg  IV x1, and Zofran 4mg  IV x1, and was started on a Heparin infusion. -Admit to medical telemetry, attending Dr. -Regular diet, up with assistance -Heparin infusion -Transition to DOAC before discharge -Continuous cardiac and respiratory monitoring -Consider BNP, echo to further  assess heart strain -Consider age-appropriate cancer screening (PSA here, colonoscopy as outpatient)  Osteoarthritis Chronic, stable. Patient has a history of osteoarthritis, predominantly in his hands, elbows, and knees. Takes Tylenol prn at home. -Tylenol prn for pain  HLD Patient's most recent lipid panel in July 2020: Total cholesterol 246, TG 244, HDL 82, LDL 115. Patient had normal right heart cath in July 2016. No home meds. -Obtain lipid panel  FEN/GI: Regular diet Prophylaxis: on Heparin infusion for bilateral PE  Disposition: med-surg  History of Present Illness:  Andrew Frye is a 59 y.o. male presenting with chest pain for 6-7 months with acute worsening since Sunday, and significantly worse this morning. Pain is located in left chest, just below his nipple line. Will sometimes also have pain in right lateral chest. The pain appears random and he does not notice increase with exertion or eating. It is described as intermittent stabbing, sometimes has fluttering sensation. He also reports SOB. States his breathing has felt different since he had COVID in April 2020. Denies diaphoresis, n/v/d, abdominal pain, constipation, fever/chills, or problems with urination.  Review Of Systems: Per HPI  Review of Systems   Patient Active Problem List   Diagnosis Date Noted  . COVID-19 virus detected 10/21/2018  . Viral pneumonia 10/18/2018  . Acute respiratory distress 10/18/2018  . Acute respiratory infection   . Arthritis pain, hand 06/01/2011  . Headache(784.0) 06/01/2011  . Dental cavity 06/01/2011    Past Medical History: Past Medical History:  Diagnosis Date  . Arthritis    hands  . Migraines     Past Surgical History: History reviewed. No pertinent surgical history.  Social History: Social History   Tobacco Use  . Smoking status: Former Smoker    Types: Cigarettes  Start date: 02/15/1972    Quit date: 03/29/2000    Years since quitting: 20.0  . Smokeless  tobacco: Never Used  Vaping Use  . Vaping Use: Never used  Substance Use Topics  . Alcohol use: Yes    Comment: occasionally  . Drug use: No    Comment: Patient has a history of cocaine abuse more than 10 years ago   Additional social history:  Quit smoking 25 years ago, drinks EtOH every weekend 40 oz (last drink Saturday), no illicit drug use. Please also refer to relevant sections of EMR.  Family History: Family History  Problem Relation Age of Onset  . Cancer Maternal Grandmother        breast  . Diabetes Mother   . Kidney disease Mother   . Breast cancer Mother     Allergies and Medications: Allergies  Allergen Reactions  . Penicillins Hives    Has patient had a PCN reaction causing immediate rash, facial/tongue/throat swelling, SOB or lightheadedness with hypotension: No Has patient had a PCN reaction causing severe rash involving mucus membranes or skin necrosis: No Has patient had a PCN reaction that required hospitalization: No Has patient had a PCN reaction occurring within the last 10 years: Non If all of the above answers are "NO", then may proceed with Cephalosporin use.    No current facility-administered medications on file prior to encounter.   Current Outpatient Medications on File Prior to Encounter  Medication Sig Dispense Refill  . acetaminophen (TYLENOL) 325 MG tablet Take 2 tablets (650 mg total) by mouth every 6 (six) hours as needed for mild pain (or Fever >/= 101).    Marland Kitchen aspirin EC 81 MG tablet Take 81 mg by mouth daily.    . benzonatate (TESSALON) 100 MG capsule Take 200 mg by mouth every 4 (four) hours as needed for cough.    Marland Kitchen PROAIR HFA 108 (90 Base) MCG/ACT inhaler Inhale 2 puffs into the lungs every 4 (four) hours as needed for wheezing or shortness of breath.      Objective: BP 140/81   Pulse 69   Temp 98.1 F (36.7 C) (Oral)   Resp 17   Ht 5\' 8"  (1.727 m)   Wt 81.6 kg   SpO2 96%   BMI 27.37 kg/m  Exam: General: alert,  well-appearing, NAD Eyes: PERRL, EOMI ENTM: moist mucous membranes, oropharynx clear without edema or erythema Neck: no cervical lymphadenopathy Cardiovascular: RRR, normal S1/S2 without m/r/g Respiratory: normal work of breathing, good air movement b/l, lungs CTAB Gastrointestinal: +BS, soft, nontender, nondistended, no hepatomegaly MSK: Negative Homan's sign. No lower extremity tenderness, swelling, or redness. 5/5 lower extremity strength. Derm: No rashes, lesions or wounds noted Neuro: Grossly intact Psych: Appropriate affect, normal speech, good hygiene  Labs and Imaging: CBC BMET  Recent Labs  Lab 03/25/20 0712  WBC 9.2  HGB 15.1  HCT 44.1  PLT 271   Recent Labs  Lab 03/25/20 0712  NA 138  K 3.5  CL 105  CO2 18*  BUN 8  CREATININE 0.90  GLUCOSE 97  CALCIUM 8.8*     EKG: My own interpretation (not copied from electronic read) NSR at 77bpm, left axis deviation, left anterior block, calculated QTc 428  DG Chest 2 View Result Date: 03/25/2020 IMPRESSION: Mild basilar subsegmental atelectasis and or scarring. Exam otherwise unremarkable.   CT Angio Chest PE W and/or Wo Contrast Result Date: 03/25/2020 IMPRESSION:  1. Multiple foci of pulmonary embolus bilaterally without appreciable right heart  strain.  2. Slight aortic atherosclerosis. No thoracic aortic aneurysm or dissection.  3. Lower lung region atelectatic change. No edema or airspace opacity.  4.  No evident adenopathy.  5.  Hepatic steatosis.    Maury Dus, MD 03/25/2020, 2:01 PM PGY-1, [facility] Health Family Medicine FPTS Intern pager: [phone], text pages welcome  FPTS Upper-Level Resident Addendum   I have independently interviewed and examined the patient. I have discussed the above with the original author and agree with their documentation. My edits for correction/addition/clarification are in pink. Please see also any attending notes.   Shirlean Mylar, M.D. PGY-2, [facility] Family  Medicine 03/25/2020 10:47 PM  FPTS Service pager: [phone] (text pages welcome through AMION)

## 2020-03-25 NOTE — ED Notes (Signed)
Pharmacy called this RN, advised that heparin was still within therapeutic level, maintain at 13/hr

## 2020-03-25 NOTE — ED Triage Notes (Signed)
Pt reports intermittent left sided chest pain since 2nd week of August. Reports his breathing has not been normal since he had covid last April.

## 2020-03-25 NOTE — Plan of Care (Signed)
  Problem: Education: Goal: Knowledge of General Education information will improve Description: Including pain rating scale, medication(s)/side effects and non-pharmacologic comfort measures Outcome: Progressing   Problem: Activity: Goal: Risk for activity intolerance will decrease Outcome: Progressing   

## 2020-03-25 NOTE — Progress Notes (Signed)
ANTICOAGULATION CONSULT NOTE - Follow Up Consult  Pharmacy Consult for Heparin Indication: pulmonary embolus  Allergies  Allergen Reactions  . Penicillins Hives    Has patient had a PCN reaction causing immediate rash, facial/tongue/throat swelling, SOB or lightheadedness with hypotension: No Has patient had a PCN reaction causing severe rash involving mucus membranes or skin necrosis: No Has patient had a PCN reaction that required hospitalization: No Has patient had a PCN reaction occurring within the last 10 years: Non If all of the above answers are "NO", then may proceed with Cephalosporin use.  Has patient had a PCN reaction causing immediate rash, facial/tongue/throat swelling, SOB or lightheadedness with hypotension: No Has patient had a PCN reaction causing severe rash involving mucus membranes or skin necrosis: No Has patient had a PCN reaction that required hospitalization: No Has patient had a PCN reaction occurring within the last 10 years: Non If all of the above answers are "NO", then may proceed with Cephalosporin use.    Patient Measurements: Height: 5\' 8"  (172.7 cm) Weight: 81.6 kg (180 lb) IBW/kg (Calculated) : 68.4 Heparin Dosing Weight: 81.6 kg  Vital Signs: Temp: 98.1 F (36.7 C) (09/07 0941) Temp Source: Oral (09/07 0941) BP: 129/73 (09/07 1800) Pulse Rate: 87 (09/07 1800)  Labs: Recent Labs    03/25/20 0712 03/25/20 0928 03/25/20 2000  HGB 15.1  --   --   HCT 44.1  --   --   PLT 271  --   --   HEPARINUNFRC  --   --  0.59  CREATININE 0.90  --   --   TROPONINIHS 12 12  --     Estimated Creatinine Clearance: 83.4 mL/min (by C-G formula based on SCr of 0.9 mg/dL).   Medical History: Past Medical History:  Diagnosis Date  . Arthritis    hands  . Migraines    Assessment: 59 yr old male presented with CP/SOB, CTA with bilateral PE without right heart strain.  Pt was on no anticoagulation PTA, CBC WNL.  Initial heparin level ~ 6 hrs after  heparin 4500 units IV bolus X 1, followed by heparin infusion at 1300 units/hr, was 0.59 units/ml, which is within the goal range for this pt. Per RN, no issues with IV or bleeding observed.  Goal of Therapy:  Heparin level 0.3-0.7 units/ml Monitor platelets by anticoagulation protocol: Yes   Plan:  Continue heparin infusion at 1300 units/hr Check confirmatory heparin level in 6 hours Monitor daily heparin level, CBC Monitor for signs/symptoms of bleeding F/U transition to oral anticoagulant when able  77, PharmD, BCPS, [facility] Clinical Pharmacist 03/25/2020 8:54 PM

## 2020-03-25 NOTE — Progress Notes (Signed)
ANTICOAGULATION CONSULT NOTE - Initial Consult  Pharmacy Consult for heparin Indication: pulmonary embolus  Allergies  Allergen Reactions  . Penicillins Hives    Has patient had a PCN reaction causing immediate rash, facial/tongue/throat swelling, SOB or lightheadedness with hypotension: No Has patient had a PCN reaction causing severe rash involving mucus membranes or skin necrosis: No Has patient had a PCN reaction that required hospitalization: No Has patient had a PCN reaction occurring within the last 10 years: Non If all of the above answers are "NO", then may proceed with Cephalosporin use.     Patient Measurements: Height: 5\' 8"  (172.7 cm) Weight: 81.6 kg (180 lb) IBW/kg (Calculated) : 68.4 Heparin Dosing Weight: TBW  Vital Signs: Temp: 98.1 F (36.7 C) (09/07 0941) Temp Source: Oral (09/07 0941) BP: 140/81 (09/07 1245) Pulse Rate: 69 (09/07 1245)  Labs: Recent Labs    03/25/20 0712 03/25/20 0928  HGB 15.1  --   HCT 44.1  --   PLT 271  --   CREATININE 0.90  --   TROPONINIHS 12 12    Estimated Creatinine Clearance: 83.4 mL/min (by C-G formula based on SCr of 0.9 mg/dL).   Medical History: Past Medical History:  Diagnosis Date  . Arthritis    hands  . Migraines    Assessment: 60 YOM presenting with CP/SOB, CTA with bilateral PE without right heart strain.  No anticoagulation PTA, CBC wnl.    Goal of Therapy:  Heparin level 0.3-0.7 units/ml Monitor platelets by anticoagulation protocol: Yes   Plan:  Heparin 4500 units IV x 1, and gtt at 1300 units/hr F/u 6 hour heparin level  77, PharmD Clinical Pharmacist ED Pharmacist Phone # [phone] 03/25/2020 1:54 PM

## 2020-03-25 NOTE — Progress Notes (Signed)
   FMTS Attending Brief Note: Terisa Starr, MD  Personal pager:  [phone] FPTS Service Pager:  [phone]   Will sign resident note as it is available. Patient seen at 5449.   59 year old man with several month history of intermittent chest pain presenting with worsening chest pain and fatigue likely due to pulmonary embolism. Endorses intermittent LE edema over past few months. Feels improved on my exam. Denies syncope, recent surgery, recent travel, prior VTE. No family history of VTE. Last colonoscopy in 2013.   PMH COVID19- April 2020 Prior evaluation for atypical chest pain- negative stress test in 2016 Prior CT in 2016 showing possible pulmonary hypertension, RHC was normal.   PSH- None significant Family history- no family history of early breast, colorectal, or prostate cancer Socially- non-smoker, works multiple jobs, very active, supportive wife and family    Labs, EKG, CXR, and CT reviewed.   Cardiac: Regular rate and rhythm. Normal S1/S2. No murmurs, rubs, or gallops appreciated. Lungs: Clear bilaterally to ascultation.  Abdomen: Normoactive bowel sounds. No tenderness to deep or light palpation. No rebound or guarding.  Extremities: Warm, well perfused without edema.  Skin: Warm, dry Psych: Pleasant and appropriate   1. Unprovoked Pulmonary Embolism, PESI score of 71, will monitor overnight on telemetry, heparin drip with transition to apixaban tomorrow. Will evaluate for underlying malignancy with age-appropriate cancer screening. Consider outpatient hematology referral if no underlying cause identified. Recommend minimum of 3 months and given his otherwise very healthy state, likely lifelong anticoagulation. Query if this could be the cause of his previously evaluated chest pain (had CTPA only showing dilated PA at that time then normal RHC?).  2. Hypocalcemia, will obtain albumin to correct.  3. Mild acidosis, monitor, repeat BMP in AM.   Will sign resident note as  available.

## 2020-03-26 DIAGNOSIS — I2699 Other pulmonary embolism without acute cor pulmonale: Secondary | ICD-10-CM

## 2020-03-26 DIAGNOSIS — I2694 Multiple subsegmental pulmonary emboli without acute cor pulmonale: Secondary | ICD-10-CM

## 2020-03-26 LAB — COMPREHENSIVE METABOLIC PANEL
ALT: 13 U/L (ref 0–44)
AST: 22 U/L (ref 15–41)
Albumin: 3 g/dL — ABNORMAL LOW (ref 3.5–5.0)
Alkaline Phosphatase: 51 U/L (ref 38–126)
Anion gap: 7 (ref 5–15)
BUN: 14 mg/dL (ref 8–23)
CO2: 24 mmol/L (ref 22–32)
Calcium: 8.7 mg/dL — ABNORMAL LOW (ref 8.9–10.3)
Chloride: 109 mmol/L (ref 98–111)
Creatinine, Ser: 1.01 mg/dL (ref 0.61–1.24)
GFR calc Af Amer: >60 mL/min (ref >60)
GFR calc non Af Amer: >60 mL/min (ref >60)
Glucose, Bld: 87 mg/dL (ref 70–99)
Potassium: 3.9 mmol/L (ref 3.5–5.1)
Sodium: 140 mmol/L (ref 135–145)
Total Bilirubin: 1 mg/dL (ref 0.3–1.2)
Total Protein: 5.8 g/dL — ABNORMAL LOW (ref 6.5–8.1)

## 2020-03-26 LAB — CBC
HCT: 43.4 % (ref 39.0–52.0)
Hemoglobin: 15 g/dL (ref 13.0–17.0)
MCH: 29.9 pg (ref 26.0–34.0)
MCHC: 34.6 g/dL (ref 30.0–36.0)
MCV: 86.6 fL (ref 80.0–100.0)
Platelets: 262 10*3/uL (ref 150–400)
RBC: 5.01 MIL/uL (ref 4.22–5.81)
RDW: 16.5 % — ABNORMAL HIGH (ref 11.5–15.5)
WBC: 6.4 10*3/uL (ref 4.0–10.5)
nRBC: 0 % (ref 0.0–0.2)

## 2020-03-26 LAB — HEPARIN LEVEL (UNFRACTIONATED): Heparin Unfractionated: 0.64 IU/mL (ref 0.30–0.70)

## 2020-03-26 LAB — HEPATITIS C ANTIBODY: HCV Ab: NONREACTIVE

## 2020-03-26 MED ORDER — APIXABAN 5 MG PO TABS
10.0000 mg | ORAL_TABLET | Freq: Two times a day (BID) | ORAL | Status: DC
  Administered 2020-03-26: 10 mg via ORAL
  Filled 2020-03-26: qty 2

## 2020-03-26 MED ORDER — APIXABAN (ELIQUIS) VTE STARTER PACK (10MG AND 5MG): ORAL_TABLET | ORAL | 0 refills | Status: AC

## 2020-03-26 MED FILL — ELIQUIS STARTER PACK 5 MG T: 5 | 30 days supply | Qty: 60 | Fill #0

## 2020-03-26 NOTE — Progress Notes (Signed)
Family Medicine Teaching Service Daily Progress Note Intern Pager: [phone]  Patient name: Andrew Frye Medical record number: [id number] Date of birth: [birth year] Age: 59 y.o. Gender: male  Primary Care Provider: Verlon Au, MD Consultants: None Code Status: FULL  Pt Overview and Major Events to Date:  9/7: admitted, started Heparin infusion  Assessment and Plan:  Andrew Frye is a 59 y.o. male presenting with chest pain, found to have bilateral pulmonary emboli. PMH is significant for osteoarthritis.  Bilateral Pulmonary Emboli without R heart strain, Unprovoked Hemodynamically stable. Patient with mild left sided chest pain, rated 4/10, but no SOB. Vitals within normal limits since admission. CBC and BMP unremarkable this morning. Patient has been on Heparin infusion since admission. -Transition to Apixaban today. 10mg  BID x1 week, then 5mg  BID -Continuous cardiac monitoring -Consider age-appropriate cancer screening as outpatient  Osteoarthritis Chronic, stable. Patient with hx of OA in hands, elbows and knees. Takes Tylenol prn at home. -Tylenol prn for pain  FEN/GI: Regular diet PPx: n/a, therapeutic Heparin gtt --> apixaban today  Disposition: anticipate d/c home today  Subjective:  Patient feels well overall. Still with some left sided chest pain, rated 4/10, but states it's improving from prior. Denies SOB although has not exerted himself significantly since being admitted. No other complaints at this time. Eating/drinking well, normal stool and urine output.  Objective: Temp:  [98.2 F (36.8 C)-98.8 F (37.1 C)] 98.8 F (37.1 C) (09/08 0757) Pulse Rate:  [59-87] 64 (09/08 0757) Resp:  [13-32] 20 (09/08 0757) BP: (105-149)/(66-111) 136/89 (09/08 0757) SpO2:  [93 %-100 %] 97 % (09/08 0757) Weight:  [79 kg-79.1 kg] 79 kg (09/08 0114) Physical Exam: General: alert, well-appearing, NAD Cardiovascular: RRR, normal S1/S2 without m/r/g Respiratory:  normal work of breathing, lungs CTAB Abdomen: soft, nontender, nondistended Extremities: no peripheral edema, no lower extremity tenderness, redness, warmth or swelling.  Laboratory: Recent Labs  Lab 03/25/20 0712 03/26/20 0421  WBC 9.2 6.4  HGB 15.1 15.0  HCT 44.1 43.4  PLT 271 262   Recent Labs  Lab 03/25/20 0712 03/26/20 0420  NA 138 140  K 3.5 3.9  CL 105 109  CO2 18* 24  BUN 8 14  CREATININE 0.90 1.01  CALCIUM 8.8* 8.7*  PROT  --  5.8*  BILITOT  --  1.0  ALKPHOS  --  51  ALT  --  13  AST  --  22  GLUCOSE 97 87    Imaging/Diagnostic Tests: No new imaging since admission.   05/25/20, MD 03/26/2020, 10:39 AM PGY-1, [facility] Family Medicine FPTS Intern pager: [phone], text pages welcome

## 2020-03-26 NOTE — Progress Notes (Signed)
ANTICOAGULATION CONSULT NOTE - Follow Up Consult  Pharmacy Consult for Heparin Indication: pulmonary embolus  Allergies  Allergen Reactions  . Penicillins Hives    Has patient had a PCN reaction causing immediate rash, facial/tongue/throat swelling, SOB or lightheadedness with hypotension: No Has patient had a PCN reaction causing severe rash involving mucus membranes or skin necrosis: No Has patient had a PCN reaction that required hospitalization: No Has patient had a PCN reaction occurring within the last 10 years: Non If all of the above answers are "NO", then may proceed with Cephalosporin use.  Has patient had a PCN reaction causing immediate rash, facial/tongue/throat swelling, SOB or lightheadedness with hypotension: No Has patient had a PCN reaction causing severe rash involving mucus membranes or skin necrosis: No Has patient had a PCN reaction that required hospitalization: No Has patient had a PCN reaction occurring within the last 10 years: Non If all of the above answers are "NO", then may proceed with Cephalosporin use.    Patient Measurements: Height: 5\' 8"  (172.7 cm) Weight: 79 kg (174 lb 2.6 oz) IBW/kg (Calculated) : 68.4 Heparin Dosing Weight: 81.6 kg  Vital Signs: Temp: 98.4 F (36.9 C) (09/08 0510) Temp Source: Oral (09/08 0510) BP: 118/91 (09/08 0510) Pulse Rate: 59 (09/08 0510)  Labs: Recent Labs    03/25/20 05/25/20 03/25/20 0928 03/25/20 2000 03/26/20 0420 03/26/20 0421  HGB 15.1  --   --   --  15.0  HCT 44.1  --   --   --  43.4  PLT 271  --   --   --  262  HEPARINUNFRC  --   --  0.59 0.64  --   CREATININE 0.90  --   --  1.01  --   TROPONINIHS 12 12  --   --   --     Estimated Creatinine Clearance: 74.3 mL/min (by C-G formula based on SCr of 1.01 mg/dL).   Medical History: Past Medical History:  Diagnosis Date  . Arthritis    hands  . Migraines    Assessment: 59 yo male presented to the ED with CP and SOB, found to have bilateral  pulmonary embolus without right heart strain per CTA. Pharmacy is consulted to dose heparin. PTA the patient is not on any anticoagulation. CBC WNL. There is no bleeding noted or documented.  Heparin level this morning was therapeutic at 0.64 while running at 1300 units/hour. Per RN, no issues with IV or bleeding observed.  Goal of Therapy:  Heparin level 0.3-0.7 units/ml Monitor platelets by anticoagulation protocol: Yes   Plan:  Continue heparin infusion at 1300 units/hour Monitor daily heparin level and CBC Monitor for signs and symptoms of bleeding Follow-up transition to oral anticoagulation when able  77, PharmD, RPh  PGY-1 Pharmacy Resident 03/26/2020 7:04 AM  Please check [email or web address] for unit-specific pharmacy phone numbers.

## 2020-03-26 NOTE — Plan of Care (Signed)

## 2020-03-26 NOTE — TOC Transition Note (Signed)
Transition of Care [facility]) - CM/SW Discharge Note   Patient Details  Name: Andrew Frye MRN: [id number] Date of Birth: 08-12-1960  Transition of Care [facility]) CM/SW Contact:  Leone Haven, RN Phone Number: 03/26/2020, 3:26 PM   Clinical Narrative:    Patient is for dc TOC is supplying meds for patient.  He received work note from [employer].  NCM informed him of his next month copay for eliquis which is 20.00 per [facility] pharmacy.   Final next level of care: Home/Self Care Barriers to Discharge: No Barriers Identified   Patient Goals and CMS Choice Patient states their goals for this hospitalization and ongoing recovery are:: get better   Choice offered to / list presented to : NA  Discharge Placement                       Discharge Plan and Services                  DME Agency: NA       HH Arranged: NA          Social Determinants of Health (SDOH) Interventions     Readmission Risk Interventions No flowsheet data found.

## 2020-03-26 NOTE — Discharge Instructions (Addendum)
You were admitted to the hospital with bilateral pulmonary emboli, which is the medical term for blood clots in your lungs. We treated you with an blood thinning medication called IV Heparin. We then switched you to an oral blood thinner called Apixaban (or Eliquis), which you will need to continue for at least 3 months after discharge from the hospital. You will take 10mg  (2 tablets) twice daily for 1 week, and then 5mg  (1 tablet) twice daily for at least 3 months. See below for more information on Eliquis/blood thinning medication.  Things to know about blood thinners: If you fall and hit your head, you will need to be evaluated in the emergency room, as blood thinners can increase your risk for brain bleeds It is normal to notice some bruising on your hands and forearms You may bleed longer than normal with small cuts AVOID activities such as climbing ladders or going on roofs  It is important to follow up with your primary care doctor within the next few weeks for ongoing evaluation of why you developed these blood clots. Your primary care doctor may choose to refer you to further specialists if your symptoms persist.   Information on my medicine - ELIQUIS (apixaban)  This medication education was reviewed with me or my healthcare representative as part of my discharge preparation.    Why was Eliquis prescribed for you? Eliquis was prescribed to treat blood clots that may have been found in the veins of your legs (deep vein thrombosis) or in your lungs (pulmonary embolism) and to reduce the risk of them occurring again.  What do You need to know about Eliquis ? The starting dose is 10 mg (two 5 mg tablets) taken TWICE daily for the FIRST SEVEN (7) DAYS, then on 04/02/20 the dose is reduced to ONE 5 mg tablet taken TWICE daily.  Eliquis may be taken with or without food.   Try to take the dose about the same time in the morning and in the evening. If you have difficulty swallowing the  tablet whole please discuss with your pharmacist how to take the medication safely.  Take Eliquis exactly as prescribed and DO NOT stop taking Eliquis without talking to the doctor who prescribed the medication.  Stopping may increase your risk of developing a new blood clot.  Refill your prescription before you run out.  After discharge, you should have regular check-up appointments with your healthcare provider that is prescribing your Eliquis.    What do you do if you miss a dose? If a dose of ELIQUIS is not taken at the scheduled time, take it as soon as possible on the same day and twice-daily administration should be resumed. The dose should not be doubled to make up for a missed dose.  Important Safety Information A possible side effect of Eliquis is bleeding. You should call your healthcare provider right away if you experience any of the following: ? Bleeding from an injury or your nose that does not stop. ? Unusual colored urine (red or dark brown) or unusual colored stools (red or black). ? Unusual bruising for unknown reasons. ? A serious fall or if you hit your head (even if there is no bleeding).  Some medicines may interact with Eliquis and might increase your risk of bleeding or clotting while on Eliquis. To help avoid this, consult your healthcare provider or pharmacist prior to using any new prescription or non-prescription medications, including herbals, vitamins, non-steroidal anti-inflammatory drugs (NSAIDs) and supplements.  This website has more information on Eliquis (apixaban): http://www.eliquis.com/eliquis/home

## 2020-03-26 NOTE — Progress Notes (Signed)
D/C instructions given and reviewed. No questions voiced at this time but encouraged to call with any concerns. Tele and IV D/C'd, tolerated well.

## 2020-03-26 NOTE — Discharge Summary (Addendum)
Family Medicine Teaching [facility] Discharge Summary  Patient name: Andrew Frye Medical record number: [id number] Date of birth: [birth year] Age: 59 y.o. Gender: male Date of Admission: 03/25/2020  Date of Discharge: 03/26/2020 Admitting Physician: Shirlean Mylar, MD  Primary Care Provider: Verlon Au, MD Consultants: None  Indication for Hospitalization: Bilateral Pulmonary Emboli  Discharge Diagnoses/Problem List:  Pulmonary Emboli  Disposition: Home  Discharge Condition: Improved, stable  Discharge Exam:  General: alert, well-appearing, NAD Neck: No JVD Cardiovascular: RRR, normal S1/S2 without m/r/g Respiratory: normal work of breathing, lungs CTAB Abdomen: soft, nontender, nondistended Extremities: no peripheral edema, 2+ distal pulses. No lower extremity tenderness, redness, warmth or swelling.  Brief Hospital Course:   Bilateral Pulmonary Emboli Without Evidence of R Heart Strain, Unprovoked Patient presented with intermittent bilateral chest pain for ~6-7 months, that became acutely worse on day of presentation to the ED. Patient did not have significant SOB, but feels his breathing hasn't been at baseline since he had COVID in April 2020. Workup in the ED showed CBC and BMP within normal limits, troponin 12>12, and EKG normal sinus rhythm with LAD and left anterior block. CXR demonstrated mild bibasilar atelectasis and/or scarring but otherwise unremarkable. CTA revealed bilateral pulmonary emboli in right upper, middle and lower lobe branches as well as left main pulmonary atery and lingular branches. No evidence of right heart strain. No risk factors for DVT/PE were identified on history, so his bilateral pulmonary emboli were thought to be unprovoked.  His vitals remained stable throughout his admission. Patient was started on IV Heparin and was then transitioned to Apixaban the following day. Patient instructed to take Apixaban for at least 3 months after  discharge.  Of note, patient did have question of pulmonary HTN in the past on CT imaging, but a subsequent right heart cath in 2016 was normal. Question whether he may have had small PE in the past that resolved prior to cath.  Issues for Follow Up:  1. Patient will need age appropriate cancer screening including colonoscopy and PSA 2. Consider outpatient hematology referral if no underlying cause identified 3. Consider outpatient cardiology referral if ongoing symptoms (CP/SOB)  Significant Procedures: None  Significant Labs and Imaging:  Recent Labs  Lab 03/25/20 0712 03/26/20 0421  WBC 9.2 6.4  HGB 15.1 15.0  HCT 44.1 43.4  PLT 271 262   Recent Labs  Lab 03/25/20 0712 03/26/20 0420  NA 138 140  K 3.5 3.9  CL 105 109  CO2 18* 24  GLUCOSE 97 87  BUN 8 14  CREATININE 0.90 1.01  CALCIUM 8.8* 8.7*  ALKPHOS  --  51  AST  --  22  ALT  --  13  ALBUMIN  --  3.0*   DG Chest 2 View Result Date: 03/25/2020 IMPRESSION: Mild basilar subsegmental atelectasis and or scarring. Exam otherwise unremarkable.   CT Angio Chest PE W and/or Wo Contrast Result Date: 03/25/2020 FINDINGS: Cardiovascular: There are pulmonary emboli in several right upper lobe, right middle lobe, and right lower lobe branches. There is an incompletely obstructing pulmonary embolus in the distal left main pulmonary artery. There also pulmonary emboli in lingular branches on the left. Right ventricle to left ventricular diameter is less than 0.9, not indicative of right heart strain. There is no thoracic aortic aneurysm or dissection. Visualized great vessels appear unremarkable. There is slight aortic atherosclerosis. No pericardial effusion or pericardial thickening. Mediastinum/Nodes: Visualized thyroid is unremarkable. There is no appreciable thoracic adenopathy. No esophageal lesions  are evident. Lungs/Pleura: There are small bullae in the medial right upper lobe. There are scattered areas of atelectatic change on  the right. There is no edema or airspace opacity. No appreciable pleural effusions. Upper Abdomen: There is hepatic steatosis. Visualized upper abdominal structures otherwise appear unremarkable. Musculoskeletal: There are foci of degenerative change in the thoracic spine. No blastic or lytic bone lesions. No chest wall lesions. Review of the MIP images confirms the above findings.  IMPRESSION:  1. Multiple foci of pulmonary embolus bilaterally without appreciable right heart strain.  2. Slight aortic atherosclerosis. No thoracic aortic aneurysm or dissection.  3. Lower lung region atelectatic change. No edema or airspace opacity.  4.  No evident adenopathy.  5.  Hepatic steatosis.   Results/Tests Pending at Time of Discharge: None  Discharge Medications:  Allergies as of 03/26/2020      Reactions   Penicillins Hives   Has patient had a PCN reaction causing immediate rash, facial/tongue/throat swelling, SOB or lightheadedness with hypotension: No Has patient had a PCN reaction causing severe rash involving mucus membranes or skin necrosis: No Has patient had a PCN reaction that required hospitalization: No Has patient had a PCN reaction occurring within the last 10 years: Non If all of the above answers are "NO", then may proceed with Cephalosporin use. Has patient had a PCN reaction causing immediate rash, facial/tongue/throat swelling, SOB or lightheadedness with hypotension: No Has patient had a PCN reaction causing severe rash involving mucus membranes or skin necrosis: No Has patient had a PCN reaction that required hospitalization: No Has patient had a PCN reaction occurring within the last 10 years: Non If all of the above answers are "NO", then may proceed with Cephalosporin use.      Medication List    TAKE these medications   acetaminophen 500 MG tablet Commonly known as: TYLENOL Take 500 mg by mouth every 6 (six) hours as needed for moderate pain. What changed: Another  medication with the same name was removed. Continue taking this medication, and follow the directions you see here.   Apixaban Starter Pack (10mg  and 5mg ) Commonly known as: ELIQUIS STARTER PACK Take as directed on package: start with two-5mg  tablets twice daily for 7 days. On day 8, switch to one-5mg  tablet twice daily.       Discharge Instructions: Please refer to Patient Instructions section of EMR for full details.  Patient was counseled important signs and symptoms that should prompt return to medical care, changes in medications, dietary instructions, activity restrictions, and follow up appointments.   Follow-Up Appointments: Patient instructed to schedule follow up with PCP within 1-2 weeks.   , MD 03/26/2020, 2:34 PM PGY-1, [facility]  05/26/2020, D.O. 03/27/2020, 8:10 AM PGY-3, [facility]

## 2021-05-31 IMAGING — CR DG CHEST 2V
2 series · 2 of 2 positions shown · non-contrast
Comparison: CT 10/18/2018.  Chest x-ray 10/18/2018.

CLINICAL DATA: Chest pain.  Shortness of breath.

EXAM:
CHEST - 2 VIEW

[chest pa]
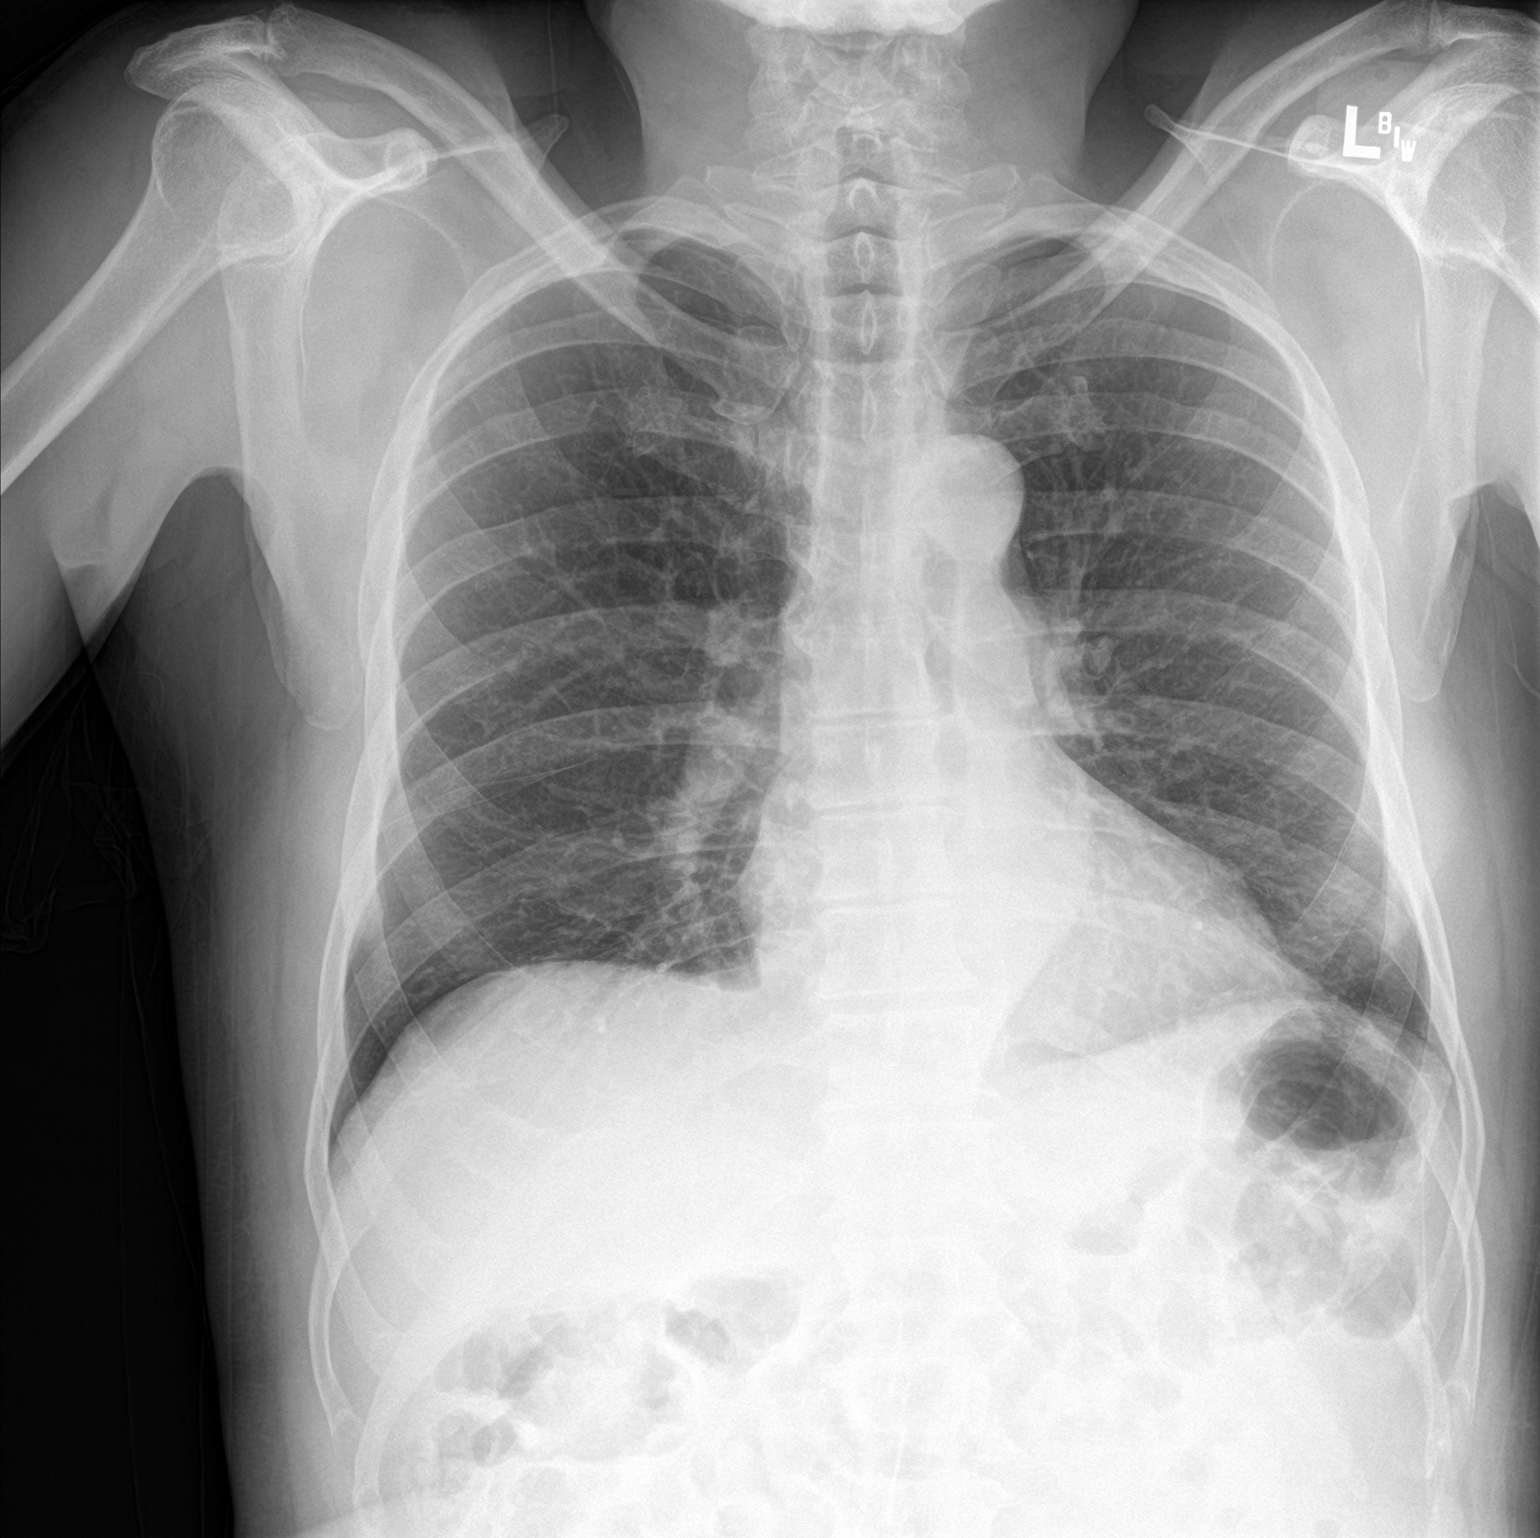

[chest lat]
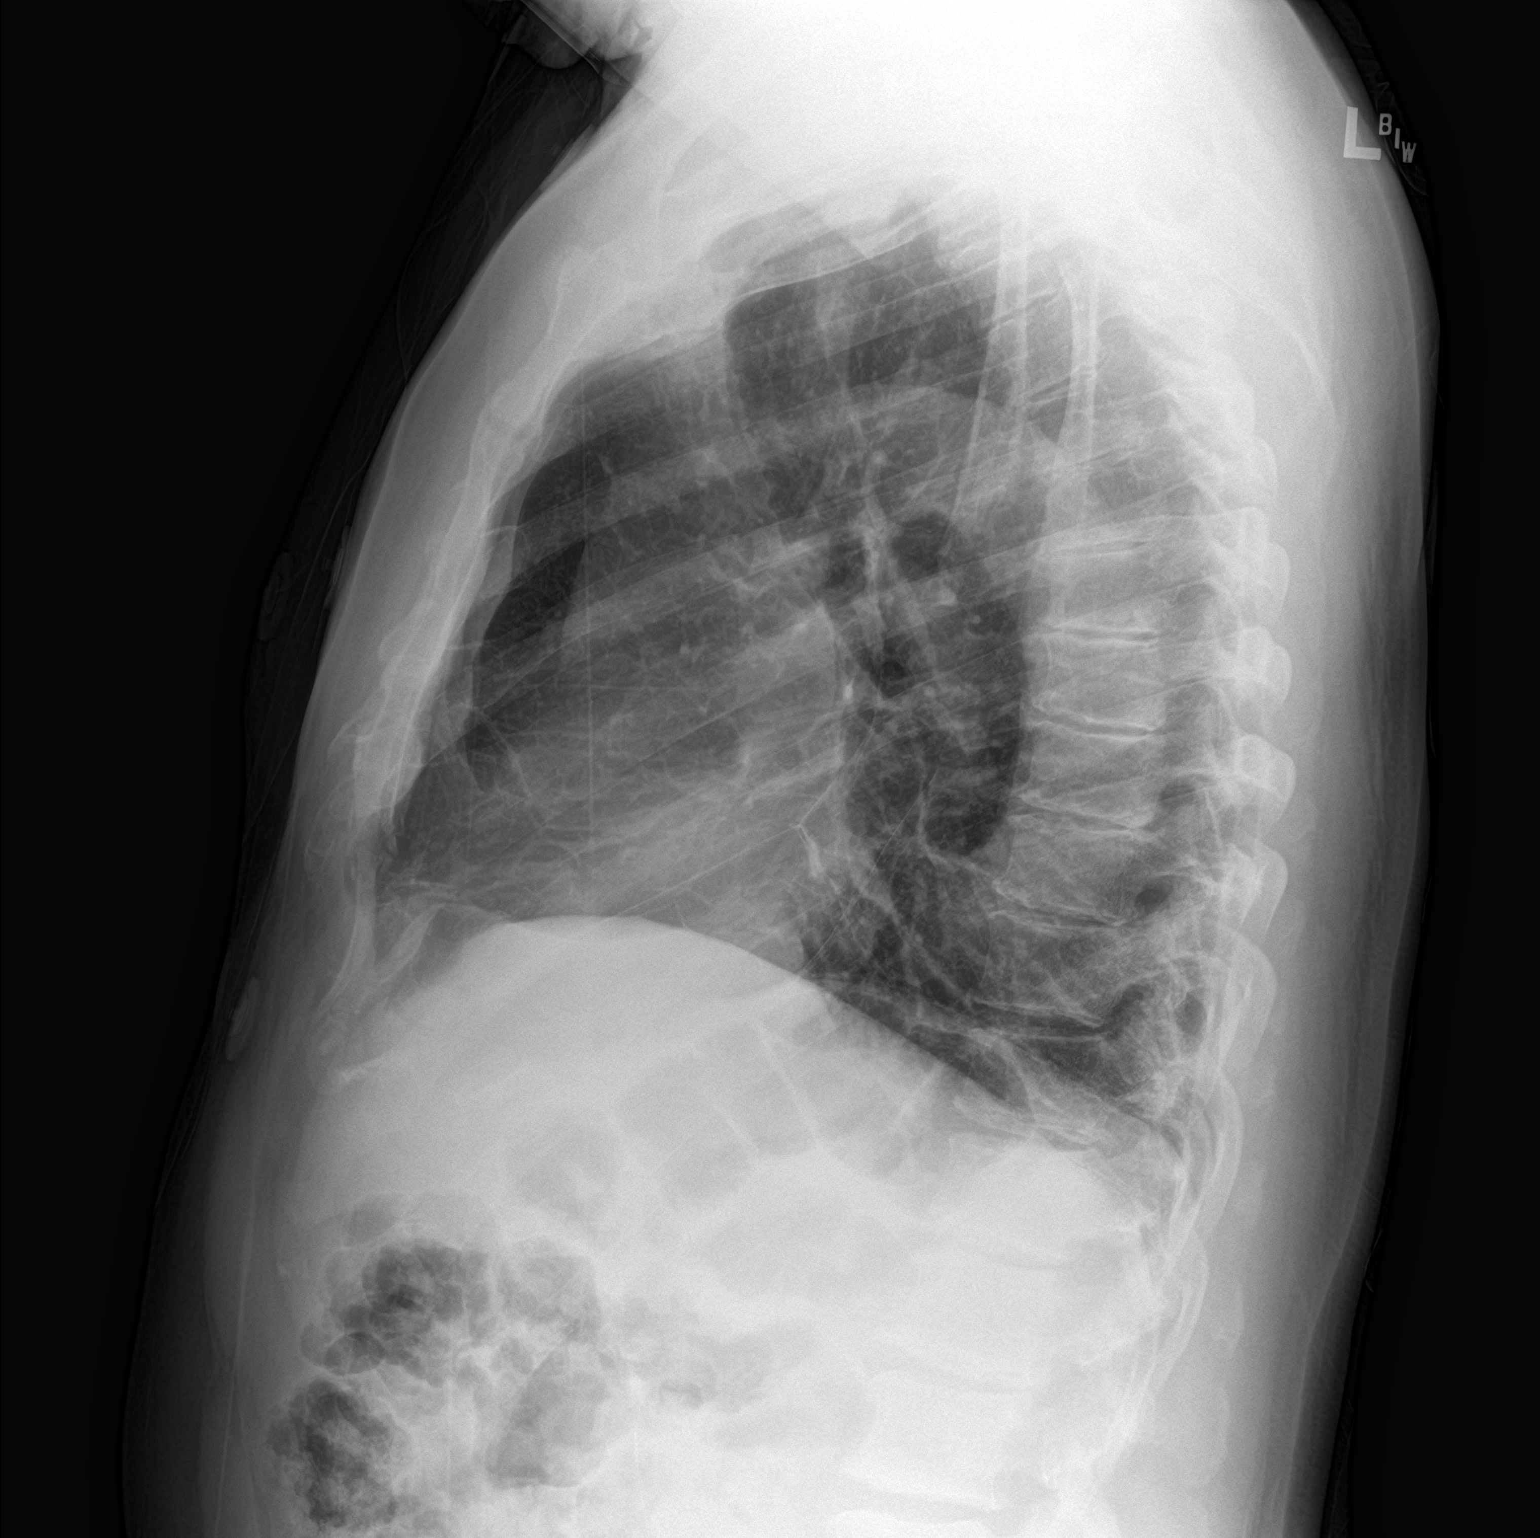

[2 of 2 positions shown; findings below may reference images not displayed]

FINDINGS: Mediastinum hilar structures normal. No focal infiltrate. Mild
bibasilar subsegmental atelectasis and or scarring. No pleural
effusion or pneumothorax. Deformity noted the left anterior second
rib, possibly secondary to old rib fracture.
IMPRESSION: Mild basilar subsegmental atelectasis and or scarring. Exam
otherwise unremarkable.

## 2022-04-29 ENCOUNTER — Emergency Department (HOSPITAL_COMMUNITY)
Admission: EM | Admit: 2022-04-29 | Discharge: 2022-04-29 | Discharge status: Against Medical Advice | Payer: Commercial Managed Care - HMO | Attending: Emergency Medicine | Admitting: Emergency Medicine

## 2022-04-29 DIAGNOSIS — Z5321 Procedure and treatment not carried out due to patient leaving prior to being seen by health care provider: Secondary | ICD-10-CM | POA: Insufficient documentation

## 2022-04-29 NOTE — ED Notes (Signed)
Pt called 2x no answer 

## 2023-07-17 ENCOUNTER — Emergency Department (HOSPITAL_COMMUNITY): Payer: Medicaid Other

## 2023-07-17 ENCOUNTER — Emergency Department (HOSPITAL_COMMUNITY)
Admission: EM | Admit: 2023-07-17 | Discharge: 2023-07-17 | Disposition: A | Discharge status: Home / Self Care | Payer: Medicaid Other | Attending: Emergency Medicine | Admitting: Emergency Medicine

## 2023-07-17 ENCOUNTER — Other Ambulatory Visit: Payer: Self-pay

## 2023-07-17 DIAGNOSIS — M25461 Effusion, right knee: Secondary | ICD-10-CM | POA: Diagnosis not present

## 2023-07-17 DIAGNOSIS — M25561 Pain in right knee: Secondary | ICD-10-CM

## 2023-07-17 MED ORDER — LIDOCAINE 5 % EX OINT: 1.0000 | TOPICAL_OINTMENT | CUTANEOUS | 1 refills | Status: AC | PRN

## 2023-07-17 MED ORDER — LIDOCAINE 5 % EX OINT: 1.0000 | TOPICAL_OINTMENT | CUTANEOUS | 1 refills | Status: DC | PRN

## 2023-07-17 NOTE — Discharge Instructions (Signed)
Please read and follow all provided instructions.  Your diagnoses today include:  1. Effusion of right knee   2. Acute pain of right knee     Tests performed today include: An x-ray of the affected area - does NOT show any broken bones, does show some arthritis and fluid collection in the joint Vital signs. See below for your results today.   Medications prescribed:   Take any prescribed medications only as directed.  Home care instructions:  Follow any educational materials contained in this packet Follow R.I.C.E. Protocol: R - rest your injury  I  - use ice on injury without applying directly to skin C - compress injury with bandage or splint E - elevate the injury as much as possible  Follow-up instructions: Please follow-up with your primary care provider or the provided orthopedic physician (bone specialist) if you continue to have significant pain in 1 week. In this case you may have a more severe injury that requires further care.   Return instructions:  Please return if your toes or feet are numb or tingling, appear gray or blue, or you have severe pain (also elevate the leg and loosen splint or wrap if you were given one) Please return to the Emergency Department if you experience worsening symptoms.  Please return if you have any other emergent concerns.  Additional Information:  Your vital signs today were: BP 120/82 (BP Location: Right Arm)   Pulse 61   Temp 98.9 F (37.2 C) (Oral)   Resp 18   SpO2 94%  If your blood pressure (BP) was elevated above 135/85 this visit, please have this repeated by your doctor within one month. -------------- If prescribed crutches for your injury: use crutches with non-weight bearing for the first few days. Then, you may walk as the pain allows, or as instructed. Start gradually with weight bearing on the affected side. Once you can walk pain free, then try jogging. When you can run forwards, then you can try moving side-to-side.  If you cannot walk without crutches in one week, you need a re-check. --------------

## 2023-07-17 NOTE — ED Triage Notes (Signed)
Pt. Stated, I injured 2 months ago at work and my rt. Knee continues to hurt. Seen at Dr's office and had a xray on Dec. 20.

## 2023-07-17 NOTE — ED Provider Notes (Cosign Needed)
[facility] EMERGENCY DEPARTMENT AT [facility] Provider Note   CSN: [id number] Arrival date & time: 07/17/23  6213     History  Chief Complaint  Patient presents with   Knee Pain    Andrew Frye is a 61 y.o. male.  Patient with h/o chronic back pain (rx oxycodone 10mg  bid) presents to the emergency department today for evaluation of ongoing right knee pain.  Symptoms started after a strenuous day of working approximately 7 to 8 weeks ago.  Patient has been seen by PCP.  He has been on oral steroids, oral anti-inflammatories, oral pain medication, topical anti-inflammatories.  He has tramadol at home which helps him rest.  He also reports some benefit from topical lidocaine that he received from a friend.  He has not yet seen orthopedics.  Pain was worse over the past couple of days, prompting emergency department visit.  No thigh or calf swelling.  No distal numbness or tingling.  He is ambulatory.  Denies history of gout or other forms of inflammatory arthritis.  Denies fever, redness, inability to flex the joint.       Home Medications Prior to Admission medications   Medication Sig Start Date End Date Taking? Authorizing Provider  acetaminophen (TYLENOL) 500 MG tablet Take 500 mg by mouth every 6 (six) hours as needed for moderate pain.    [provider]  APIXABAN Everlene Balls) VTE STARTER PACK (10MG  AND 5MG ) Take as directed on package: start with two-5mg  tablets twice daily for 7 days. On day 8, switch to one-5mg  tablet twice daily. 03/26/20   Mullis, Kiersten P, DO      Allergies    Penicillins    Review of Systems   Review of Systems  Physical Exam Updated Vital Signs BP 120/82 (BP Location: Right Arm)   Pulse 61   Temp 98.9 F (37.2 C) (Oral)   Resp 18   SpO2 94%  Physical Exam Vitals and nursing note reviewed.  Constitutional:      Appearance: He is well-developed.  HENT:     Head: Normocephalic and atraumatic.  Eyes:      Conjunctiva/sclera: Conjunctivae normal.  Cardiovascular:     Pulses: Normal pulses. No decreased pulses.  Musculoskeletal:        General: Tenderness present.     Cervical back: Normal range of motion and neck supple.     Right hip: No tenderness or bony tenderness.     Right knee: Swelling and effusion (Small) present. No erythema or bony tenderness. Normal range of motion. Tenderness present over the lateral joint line.     Right lower leg: No edema.     Left lower leg: No edema.     Right ankle: No tenderness. Normal range of motion.     Comments: Patient is able to actively flex and extend the right knee.  There is a small effusion noted.  No overlying erythema.  Reports worsening lateral joint line pain with full flexion.  Skin:    General: Skin is warm and dry.  Neurological:     Mental Status: He is alert.     Sensory: No sensory deficit.     Comments: Motor, sensation, and vascular distal to the injury is fully intact.   Psychiatric:        Mood and Affect: Mood normal.     ED Results / Procedures / Treatments   Labs (all labs ordered are listed, but only abnormal results are displayed) Labs Reviewed -  No data to display  EKG None  Radiology DG Knee Complete 4 Views Right Result Date: 07/17/2023 CLINICAL DATA:  Right knee pain. EXAM: RIGHT KNEE - COMPLETE 4+ VIEW COMPARISON:  07/08/2023. FINDINGS: No acute fracture or dislocation. No aggressive osseous lesion. There are degenerative changes of the knee joint in the form of mildly reduced tibio-femoral compartment joint space, tibial spiking and tricompartmental osteophytosis. There is small suprapatellar knee joint effusion. No focal soft tissue swelling. No radiopaque foreign bodies. IMPRESSION: *No acute osseous abnormality of the right knee joint. Electronically Signed   By: Jules Schick M.D.   On: 07/17/2023 11:42    Procedures Procedures    Medications Ordered in ED Medications - No data to display  ED  Course/ Medical Decision Making/ A&P    Patient seen and examined. History obtained directly from patient. Work-up including labs, imaging, EKG ordered in triage, if performed, were reviewed.    Labs/EKG: None ordered  Imaging: Independently reviewed and interpreted.  This included: X-ray of the knee, agree no dislocations or fractures.  Medications/Fluids: None ordered  Most recent vital signs reviewed and are as follows: BP 120/82 (BP Location: Right Arm)   Pulse 61   Temp 98.9 F (37.2 C) (Oral)   Resp 18   SpO2 94%   Initial impression: Subacute to chronic right knee pain after injury, would benefit from orthopedic follow-up.  Will provide with knee sleeve.  Will give topical lidocaine prescription.  Patient is already on narcotics chronically at home.  Home treatment plan: Continue RICE protocol  Return instructions discussed with patient: Orthopedics in 1 week  Follow-up instructions discussed with patient: Return with new or worsening symptoms                                Medical Decision Making Amount and/or Complexity of Data Reviewed Radiology: ordered.   Patient with ongoing right-sided knee pain.  X-ray negative for acute injury today.  He does have an effusion, although small.  Patient is able to flex and extend the knee and I do not suspect septic arthritis.  No constitutional symptoms.  Suspect this is inflammatory from injury which will need to be further evaluated by orthopedics.          Final Clinical Impression(s) / ED Diagnoses Final diagnoses:  Effusion of right knee  Acute pain of right knee    Rx / DC Orders ED Discharge Orders          Ordered    lidocaine (XYLOCAINE) 5 % ointment  As needed        07/17/23 1253              Renne Crigler, PA-C 07/17/23 1254

## 2023-11-16 ENCOUNTER — Other Ambulatory Visit: Payer: Self-pay | Admitting: Ophthalmology

## 2023-11-16 DIAGNOSIS — H469 Unspecified optic neuritis: Secondary | ICD-10-CM

## 2023-12-02 ENCOUNTER — Ambulatory Visit
Admission: RE | Admit: 2023-12-02 | Discharge: 2023-12-02 | Disposition: A | Discharge status: Home / Self Care | Source: Ambulatory Visit | Attending: Ophthalmology | Admitting: Ophthalmology

## 2023-12-02 DIAGNOSIS — H469 Unspecified optic neuritis: Secondary | ICD-10-CM

## 2023-12-02 MED ORDER — GADOPICLENOL 0.5 MMOL/ML IV SOLN
8.0000 mL | Freq: Once | INTRAVENOUS | Status: AC | PRN
  Administered 2023-12-02: 8 mL via INTRAVENOUS

## 2024-01-26 NOTE — Telephone Encounter (Signed)
Pt has an appt today at 4 pm.

## 2024-06-24 ENCOUNTER — Other Ambulatory Visit: Payer: Self-pay

## 2024-06-24 ENCOUNTER — Encounter (HOSPITAL_BASED_OUTPATIENT_CLINIC_OR_DEPARTMENT_OTHER): Payer: Self-pay

## 2024-06-24 ENCOUNTER — Emergency Department (HOSPITAL_BASED_OUTPATIENT_CLINIC_OR_DEPARTMENT_OTHER)

## 2024-06-24 ENCOUNTER — Emergency Department (HOSPITAL_BASED_OUTPATIENT_CLINIC_OR_DEPARTMENT_OTHER)
Admission: EM | Admit: 2024-06-24 | Discharge: 2024-06-24 | Disposition: A | Discharge status: Home / Self Care | Attending: Emergency Medicine | Admitting: Emergency Medicine

## 2024-06-24 DIAGNOSIS — R519 Headache, unspecified: Secondary | ICD-10-CM

## 2024-06-24 DIAGNOSIS — H579 Unspecified disorder of eye and adnexa: Secondary | ICD-10-CM

## 2024-06-24 LAB — BASIC METABOLIC PANEL WITH GFR
Anion gap: 10 (ref 5–15)
BUN: 11 mg/dL (ref 8–23)
CO2: 24 mmol/L (ref 22–32)
Calcium: 9.6 mg/dL (ref 8.9–10.3)
Chloride: 106 mmol/L (ref 98–111)
Creatinine, Ser: 1.02 mg/dL (ref 0.61–1.24)
GFR, Estimated: >60 mL/min (ref >60)
Glucose, Bld: 110 mg/dL — ABNORMAL HIGH (ref 70–99)
Potassium: 4 mmol/L (ref 3.5–5.1)
Sodium: 140 mmol/L (ref 135–145)

## 2024-06-24 LAB — CBC
HCT: 43.5 % (ref 39.0–52.0)
Hemoglobin: 15.1 g/dL (ref 13.0–17.0)
MCH: 28.4 pg (ref 26.0–34.0)
MCHC: 34.7 g/dL (ref 30.0–36.0)
MCV: 81.8 fL (ref 80.0–100.0)
Platelets: 308 K/uL (ref 150–400)
RBC: 5.32 MIL/uL (ref 4.22–5.81)
RDW: 15 % (ref 11.5–15.5)
WBC: 6.4 K/uL (ref 4.0–10.5)
nRBC: 0 % (ref 0.0–0.2)

## 2024-06-24 MED ORDER — METOCLOPRAMIDE HCL 5 MG/ML IJ SOLN
10.0000 mg | Freq: Once | INTRAMUSCULAR | Status: AC
  Administered 2024-06-24: 10 mg via INTRAVENOUS
  Filled 2024-06-24: qty 2

## 2024-06-24 MED ORDER — TRAMADOL HCL 50 MG PO TABS: 50.0000 mg | ORAL_TABLET | Freq: Four times a day (QID) | ORAL | 0 refills | Status: AC | PRN

## 2024-06-24 MED ORDER — IOHEXOL 350 MG/ML SOLN
75.0000 mL | Freq: Once | INTRAVENOUS | Status: AC | PRN
  Administered 2024-06-24: 75 mL via INTRAVENOUS

## 2024-06-24 NOTE — ED Triage Notes (Signed)
 He staest that for months now he has had issues with the vision in his right eye being problematic. His physician referred him to a neuro ophthalmologist, but the appointment is for a couple of weeks from now. He is here today with c/o headache and decreased (but not absent) vision in his right eye. He is ambulatory and in no distress.

## 2024-06-24 NOTE — ED Provider Notes (Signed)
 [place] EMERGENCY DEPARTMENT AT [facility] Provider Note   CSN: [id number] Arrival date & time: 06/24/24  1218     Patient presents with: Eye Problem   Andrew Frye is a 63 y.o. male.   Patient c/o intermittent headache, predominantly right frontal, and persistent visual symptoms for past year. Indicates has seen pcp and ophthy w same - had mri orbits last summer - indicates was told no cataracts noted, and that local ophthy made appt with neuroophthy doc at [facility] next month. Indicates symptoms persist/recur including in past several days. No new or different visual symptoms. States seems blurry at times, esp in right eye. Denies hx migraines or complicated migraines. Denies acute, abrupt, severe, or thunderclap type head pain. Denies temporal tenderness/sensitivity. No new change in speech or vision. No new problems with balance or coordination. No focal or unilateral numbness or weakness. No head trauma. No syncope. Denies eye pain, redness or tearing. No neck pain or stiffness. No sinus pain or drainage. No uri symptoms. No fever or chills. No associated nv, photophobia or phonophobia. Symptoms do not change by position or time of day. No consistent exacerbating or alleviating factors.   The history is provided by the patient and medical records.  Eye Problem Associated symptoms: headaches   Associated symptoms: no nausea, no numbness, no redness, no vomiting and no weakness        Prior to Admission medications   Medication Sig Start Date End Date Taking? Authorizing Provider  acetaminophen  (TYLENOL ) 500 MG tablet Take 500 mg by mouth every 6 (six) hours as needed for moderate pain.    [provider]  APIXABAN  (ELIQUIS ) VTE STARTER PACK (10MG  AND 5MG ) Take as directed on package: start with two-5mg  tablets twice daily for 7 days. On day 8, switch to one-5mg  tablet twice daily. 03/26/20   Mullis, Kiersten P, DO  lidocaine  (XYLOCAINE ) 5 % ointment Apply 1  Application topically as needed. 07/17/23   Desiderio Chew, PA-C    Allergies: Penicillins    Review of Systems  Constitutional:  Negative for fever.  HENT:  Negative for sinus pain and sore throat.   Eyes:  Positive for visual disturbance. Negative for pain and redness.  Respiratory:  Negative for cough and shortness of breath.   Cardiovascular:  Negative for chest pain.  Gastrointestinal:  Negative for abdominal pain, nausea and vomiting.  Genitourinary:  Negative for flank pain.  Musculoskeletal:  Negative for back pain, neck pain and neck stiffness.  Skin:  Negative for rash.  Neurological:  Positive for headaches. Negative for syncope, speech difficulty, weakness and numbness.    Updated Vital Signs BP 108/84 (BP Location: Right Arm)   Pulse (!) 59   Temp 98.7 F (37.1 C) (Oral)   Resp (!) 21   SpO2 96%   Physical Exam Vitals and nursing note reviewed.  Constitutional:      Appearance: Normal appearance. He is well-developed.  HENT:     Head: Atraumatic.     Comments: No sinus or temporal tenderness.     Nose: Nose normal.     Mouth/Throat:     Mouth: Mucous membranes are moist.     Pharynx: Oropharynx is clear.  Eyes:     General: No scleral icterus.    Extraocular Movements: Extraocular movements intact.     Conjunctiva/sclera: Conjunctivae normal.     Pupils: Pupils are equal, round, and reactive to light.     Comments: Fundoscopic, no PE/acute process noted.  Neck:     Vascular: No carotid bruit.     Trachea: No tracheal deviation.     Comments: Trachea midline. No neck mass, no stiffness or rigidity. No bruits.  Cardiovascular:     Rate and Rhythm: Normal rate and regular rhythm.     Pulses: Normal pulses.     Heart sounds: Normal heart sounds. No murmur heard.    No friction rub. No gallop.  Pulmonary:     Effort: Pulmonary effort is normal. No accessory muscle usage or respiratory distress.     Breath sounds: Normal breath sounds.  Abdominal:      General: There is no distension.     Palpations: Abdomen is soft.     Tenderness: There is no abdominal tenderness.  Musculoskeletal:        General: No swelling.     Cervical back: Normal range of motion and neck supple. No rigidity or tenderness.     Right lower leg: No edema.     Left lower leg: No edema.  Skin:    General: Skin is warm and dry.     Findings: No rash.  Neurological:     General: No focal deficit present.     Mental Status: He is alert.     Comments: Alert, speech clear. No dysarthria or aphasia. Motor/sens fxn grossly intact bil. Stre 5/5 bilat. No pronator drift. Finger to nose wnl bil. Steady gait, no ataxia.  No visual field deficit noted (pt notes in peripheral areas esp generally blurry vision right right eye).   Psychiatric:        Mood and Affect: Mood normal.     (all labs ordered are listed, but only abnormal results are displayed) Results for orders placed or performed during the hospital encounter of 06/24/24  CBC   Collection Time: 06/24/24 12:49 PM  Result Value Ref Range   WBC 6.4 4.0 - 10.5 K/uL   RBC 5.32 4.22 - 5.81 MIL/uL   Hemoglobin 15.1 13.0 - 17.0 g/dL   HCT 56.4 60.9 - 47.9 %   MCV 81.8 80.0 - 100.0 fL   MCH 28.4 26.0 - 34.0 pg   MCHC 34.7 30.0 - 36.0 g/dL   RDW 84.9 88.4 - 84.4 %   Platelets 308 150 - 400 K/uL   nRBC 0.0 0.0 - 0.2 %  Basic metabolic panel with GFR   Collection Time: 06/24/24 12:49 PM  Result Value Ref Range   Sodium 140 135 - 145 mmol/L   Potassium 4.0 3.5 - 5.1 mmol/L   Chloride 106 98 - 111 mmol/L   CO2 24 22 - 32 mmol/L   Glucose, Bld 110 (H) 70 - 99 mg/dL   BUN 11 8 - 23 mg/dL   Creatinine, Ser 8.97 0.61 - 1.24 mg/dL   Calcium 9.6 8.9 - 89.6 mg/dL   GFR, Estimated >39 >39 mL/min   Anion gap 10 5 - 15      EKG: EKG Interpretation Date/Time:  Sunday June 24 2024 12:33:02 EST Ventricular Rate:  67 PR Interval:  167 QRS Duration:  104 QT Interval:  429 QTC Calculation: 453 R  Axis:   -52  Text Interpretation: Sinus rhythm Incomplete RBBB and LAFB Nonspecific T wave abnormality Confirmed by Bernard Drivers ([id number]) on 06/24/2024 12:35:11 PM  Radiology: CT Angio Head Neck W WO CM Result Date: 06/24/2024 EXAM: CT HEAD WITHOUT CTA HEAD AND NECK WITH AND WITHOUT 06/24/2024 01:31:34 PM TECHNIQUE: CTA of the head and neck was performed  with and without the administration of 75 mL of intravenous iohexol  (OMNIPAQUE ) 350 MG/ML injection. Noncontrast CT of the head with reconstructed 2-D images are also provided for review. Multiplanar 2D and/or 3D reformatted images are provided for review. Automated exposure control, iterative reconstruction, and/or weight based adjustment of the mA/kV was utilized to reduce the radiation dose to as low as reasonably achievable. COMPARISON: None available CLINICAL HISTORY: Neuro deficit, acute, stroke suspected; acute/chronic visual complaints, headache. Abnormal vision in the right eye. FINDINGS: CT HEAD: BRAIN AND VENTRICLES: No acute intracranial hemorrhage. No mass effect or midline shift. No extra-axial fluid collection. Gray-white differentiation is maintained. No hydrocephalus. ORBITS: No acute abnormality. SINUSES: No acute abnormality. SOFT TISSUES AND SKULL: No acute abnormality. CTA NECK: AORTIC ARCH AND ARCH VESSELS: No dissection or arterial injury. No significant stenosis of the brachiocephalic or subclavian arteries. The left vertebral artery originates from the aortic arch, a normal variant. CERVICAL CAROTID ARTERIES: No dissection, arterial injury, or hemodynamically significant stenosis by NASCET [employer] Symptomatic Carotid Endarterectomy Trial) criteria. CERVICAL VERTEBRAL ARTERIES: No dissection, arterial injury, or significant stenosis. VISUALIZED LUNGS AND MEDIASTINUM: Unremarkable. SOFT TISSUES: No acute abnormality. BONES: No acute abnormality. CTA HEAD: ANTERIOR CIRCULATION: No significant stenosis of the internal carotid  arteries. No significant stenosis of the anterior cerebral arteries. No significant stenosis of the middle cerebral arteries. No aneurysm. POSTERIOR CIRCULATION: No significant stenosis of the posterior cerebral arteries. No significant stenosis of the basilar artery. No significant stenosis of the vertebral arteries. No aneurysm. OTHER: No dural venous sinus thrombosis on this non-dedicated study. IMPRESSION: 1. No acute intracranial abnormality. 2. No large vessel occlusion, hemodynamically significant stenosis, or aneurysm in the head or neck. Electronically signed by: Lonni Necessary MD 06/24/2024 01:47 PM EST RP Workstation: [id number]     Procedures   Medications Ordered in the ED  metoCLOPramide  (REGLAN ) injection 10 mg (10 mg Intravenous Given 06/24/24 1254)  iohexol  (OMNIPAQUE ) 350 MG/ML injection 75 mL (75 mLs Intravenous Contrast Given 06/24/24 1324)                                    Medical Decision Making Problems Addressed: Intermittent headache: acute illness or injury    Details: Acute/chronic Visual symptoms: acute illness or injury    Details: Acute/chronic  Amount and/or Complexity of Data Reviewed Independent Historian: spouse    Details: hx External Data Reviewed: notes. Labs: ordered. Decision-making details documented in ED Course. Radiology: ordered and independent interpretation performed. Decision-making details documented in ED Course. ECG/medicine tests: ordered and independent interpretation performed. Decision-making details documented in ED Course.  Risk Prescription drug management. Decision regarding hospitalization.   Labs ordered/sent. Imaging ordered.   Differential diagnosis includes migraine, cva, visual dist nos, etc. Dispo decision including potential need for admission considered - will get labs and imaging and reassess.   Reviewed nursing notes and prior charts for additional history. External reports reviewed. Prior mri for same  symptoms with no acute process then.   Reglan  iv.   Cardiac monitor: sinus rhythm, rate  Labs reviewed/interpreted by me - wbc and hgb normal. Chem normal.   CTA reviewed/interpreted by me - no acute process.   Recheck no pain or discomfort.   Rec close pcp, neuroophthy (as already arranged), and neurology (referral provided)  f/u.  Return precautions provided.       Final diagnoses:  Intermittent headache  Visual symptoms    ED Discharge  Orders          Ordered    Ambulatory referral to Neurology       Comments: An appointment is requested in approximately: 1 week   06/24/24 1522               Bernard Drivers, MD 06/24/24 1525

## 2024-06-24 NOTE — ED Notes (Signed)
 Patient transported to CT

## 2024-06-24 NOTE — Discharge Instructions (Addendum)
 It was our pleasure to provide your ER care today - we hope that you feel better.  Take acetaminophen  as need. You may also take ultram  as need for pain - no driving when taking.   Follow up closely with the neuro-ophthalmologist as planned.   Also follow up closely with neurologist in the next 1-2 weeks - call office to arrange appointment.   Return to ER right away if worse, new symptoms, severe headache, fevers, new change in or worsening of speech or vision, one-sided numbness or weakness, or other concern.
